# Patient Record
Sex: Female | Born: 1980 | Race: White | Hispanic: No | Marital: Married | Smoking: Never smoker
Health system: Southern US, Community
[De-identification: ages and names within clinical notes are randomized; demographics above are authoritative.]

## PROBLEM LIST (undated history)

## (undated) DIAGNOSIS — G4730 Sleep apnea, unspecified: Secondary | ICD-10-CM

## (undated) DIAGNOSIS — R51 Headache: Secondary | ICD-10-CM

## (undated) DIAGNOSIS — N200 Calculus of kidney: Secondary | ICD-10-CM

## (undated) DIAGNOSIS — F329 Major depressive disorder, single episode, unspecified: Secondary | ICD-10-CM

## (undated) DIAGNOSIS — G5601 Carpal tunnel syndrome, right upper limb: Secondary | ICD-10-CM

## (undated) DIAGNOSIS — M1990 Unspecified osteoarthritis, unspecified site: Secondary | ICD-10-CM

## (undated) DIAGNOSIS — M797 Fibromyalgia: Secondary | ICD-10-CM

## (undated) DIAGNOSIS — K219 Gastro-esophageal reflux disease without esophagitis: Secondary | ICD-10-CM

## (undated) DIAGNOSIS — O99345 Other mental disorders complicating the puerperium: Secondary | ICD-10-CM

## (undated) DIAGNOSIS — F32A Depression, unspecified: Secondary | ICD-10-CM

## (undated) DIAGNOSIS — F53 Postpartum depression: Secondary | ICD-10-CM

## (undated) DIAGNOSIS — O361190 Maternal care for Anti-A sensitization, unspecified trimester, not applicable or unspecified: Secondary | ICD-10-CM

## (undated) DIAGNOSIS — E669 Obesity, unspecified: Secondary | ICD-10-CM

## (undated) HISTORY — PX: LITHOTRIPSY: SUR834

## (undated) HISTORY — DX: Sleep apnea, unspecified: G47.30

---

## 2002-02-19 ENCOUNTER — Inpatient Hospital Stay (HOSPITAL_COMMUNITY): Admission: AD | Admit: 2002-02-19 | Discharge: 2002-02-22 | Payer: Self-pay | Admitting: Obstetrics and Gynecology

## 2002-07-06 ENCOUNTER — Other Ambulatory Visit: Admission: RE | Admit: 2002-07-06 | Discharge: 2002-07-06 | Payer: Self-pay | Admitting: Obstetrics and Gynecology

## 2002-11-02 ENCOUNTER — Ambulatory Visit (HOSPITAL_COMMUNITY): Admission: RE | Admit: 2002-11-02 | Discharge: 2002-11-02 | Payer: Self-pay | Admitting: Obstetrics and Gynecology

## 2002-11-02 ENCOUNTER — Encounter: Payer: Self-pay | Admitting: Obstetrics and Gynecology

## 2002-11-04 ENCOUNTER — Ambulatory Visit (HOSPITAL_COMMUNITY): Admission: RE | Admit: 2002-11-04 | Discharge: 2002-11-04 | Payer: Self-pay | Admitting: Obstetrics and Gynecology

## 2002-11-04 ENCOUNTER — Encounter: Payer: Self-pay | Admitting: Obstetrics and Gynecology

## 2002-12-02 ENCOUNTER — Inpatient Hospital Stay (HOSPITAL_COMMUNITY): Admission: AD | Admit: 2002-12-02 | Discharge: 2002-12-02 | Payer: Self-pay | Admitting: Obstetrics and Gynecology

## 2002-12-16 ENCOUNTER — Encounter: Payer: Self-pay | Admitting: Obstetrics and Gynecology

## 2002-12-16 ENCOUNTER — Observation Stay (HOSPITAL_COMMUNITY): Admission: AD | Admit: 2002-12-16 | Discharge: 2002-12-17 | Payer: Self-pay | Admitting: Obstetrics and Gynecology

## 2002-12-28 ENCOUNTER — Inpatient Hospital Stay (HOSPITAL_COMMUNITY): Admission: AD | Admit: 2002-12-28 | Discharge: 2003-01-01 | Payer: Self-pay | Admitting: Obstetrics and Gynecology

## 2002-12-28 ENCOUNTER — Ambulatory Visit (HOSPITAL_COMMUNITY): Admission: RE | Admit: 2002-12-28 | Discharge: 2002-12-28 | Payer: Self-pay | Admitting: Obstetrics and Gynecology

## 2002-12-28 ENCOUNTER — Encounter: Payer: Self-pay | Admitting: Obstetrics and Gynecology

## 2003-08-04 ENCOUNTER — Other Ambulatory Visit: Admission: RE | Admit: 2003-08-04 | Discharge: 2003-08-04 | Payer: Self-pay | Admitting: Obstetrics and Gynecology

## 2004-07-20 ENCOUNTER — Other Ambulatory Visit: Admission: RE | Admit: 2004-07-20 | Discharge: 2004-07-20 | Payer: Self-pay | Admitting: Obstetrics and Gynecology

## 2010-08-18 ENCOUNTER — Inpatient Hospital Stay (HOSPITAL_COMMUNITY): Admission: AD | Admit: 2010-08-18 | Payer: Self-pay | Source: Ambulatory Visit | Admitting: Obstetrics and Gynecology

## 2010-10-13 ENCOUNTER — Other Ambulatory Visit (HOSPITAL_COMMUNITY): Payer: Self-pay | Admitting: Obstetrics and Gynecology

## 2010-10-18 ENCOUNTER — Encounter (HOSPITAL_COMMUNITY): Payer: Self-pay

## 2010-10-18 ENCOUNTER — Ambulatory Visit (HOSPITAL_COMMUNITY)
Admission: RE | Admit: 2010-10-18 | Discharge: 2010-10-18 | Disposition: A | Discharge status: Home / Self Care | Payer: BC Managed Care – PPO | Source: Ambulatory Visit | Attending: Obstetrics and Gynecology | Admitting: Obstetrics and Gynecology

## 2010-10-18 DIAGNOSIS — O361190 Maternal care for Anti-A sensitization, unspecified trimester, not applicable or unspecified: Secondary | ICD-10-CM | POA: Insufficient documentation

## 2010-10-18 HISTORY — DX: Maternal care for anti-A sensitization, unspecified trimester, not applicable or unspecified: O36.1190

## 2010-10-18 NOTE — Progress Notes (Signed)
Laura Tucker is a 30 y.o. female patient G3 P1101 at approximately [redacted] weeks gestation presents for MFM consultation.    No complaints today.   OB history:  G1 - preeclampsia in late gestation with delivery at 37 weeks;  Patient describes events consistent with intrapartum abruption, shoulder dystocia after vacuum assisted delivery, and neonatal demise of this infant.  G2 - conceived less than 2 months after 1st delivery; Patient reports positive antibody screen in early gestation manage by [facility] MFM service.  She reports undergoing amniocentesis in early gestation for determination of fetal antigen status with need for close ultrasound surveillance through the pregnancy.  She denies need for intrauterine transfusion and also denies development of preeclampsia with this gestation.  She reports undergoing amniocentesis again at [redacted] weeks gestation for determination of fetal lung maturity but developing preterm labor shortly after the procedure.  She underwent primary cesarean section due to her prior poor pregnancy history.  She denies complications.  This child is reported to be alive and well. Patient states she was treated with low dose aspirin during this pregnancy for prevention of preeclampsia.   In the current pregnancy, antibody screen was initially negative.  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  Patient denies any other medical problems or pregnancy complications.      Past Medical History  Diagnosis Date  . 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 *    Review of Systems  All other systems reviewed and are negative.    Physical Exam  Nursing note and vitals reviewed. Constitutional: No distress.    Patient's pregnancy history  discussed as well as the implications of her antibody screen.  Given that current titers are low and late developing in this gestation, overall prognosis is very favorable.  At this time, we recommend serial evaluations of her antibody titers, at least monthly, through the remainder of the pregnancy.  If these reach a value of 1:16, serial ultrasound monitoring of fetal MCA velocities is indicated as well as regular assessments for the development of hydrops.  If these values remain under 1:16, monitoring of MCA velocities does not provide additional benefit and is not indicated.    Given, patient's past pregnancy history, close surveillance for the development of preeclampsia is indicated and evaluation of fetal growth and amniotic fluid volume serially through the third trimester every 4-6 weeks may be of benefit.  Antenatal testing beginning at [redacted] weeks gestation (sooner if other complications develop) is also recommended.  She states that she plans a repeat cesarean section.  Review of prior operative notes are recommended.  Signs and symptoms of preeclampsia were reviewed.    Patient also inquired about starting low dose aspirin for the prevention of preeclampsia.  Given her gestational age, the possible increased risks of abruption with aspirin use,  and the inconsistency of current evidence related to this, it was not recommended that she initiate this therapy at this time.   Questions were answered and precautions given.  We would be pleased to participate further in any way that you desire in the care of this patient.  Please contact us at any time if we can be of assistance.    Lott Seelbach L. 10/18/2010

## 2010-10-24 ENCOUNTER — Encounter (HOSPITAL_COMMUNITY): Payer: Self-pay

## 2010-10-24 ENCOUNTER — Inpatient Hospital Stay (HOSPITAL_COMMUNITY): Payer: BC Managed Care – PPO

## 2010-10-24 ENCOUNTER — Inpatient Hospital Stay (HOSPITAL_COMMUNITY)
Admission: AD | Admit: 2010-10-24 | Discharge: 2010-10-24 | Disposition: A | Discharge status: Home / Self Care | Payer: BC Managed Care – PPO | Source: Ambulatory Visit | Attending: Obstetrics and Gynecology | Admitting: Obstetrics and Gynecology

## 2010-10-24 ENCOUNTER — Other Ambulatory Visit: Payer: Self-pay | Admitting: Obstetrics and Gynecology

## 2010-10-24 DIAGNOSIS — N390 Urinary tract infection, site not specified: Secondary | ICD-10-CM | POA: Insufficient documentation

## 2010-10-24 DIAGNOSIS — O2390 Unspecified genitourinary tract infection in pregnancy, unspecified trimester: Secondary | ICD-10-CM | POA: Insufficient documentation

## 2010-10-24 DIAGNOSIS — O4690 Antepartum hemorrhage, unspecified, unspecified trimester: Secondary | ICD-10-CM

## 2010-10-24 HISTORY — DX: Calculus of kidney: N20.0

## 2010-10-24 LAB — URINALYSIS, ROUTINE W REFLEX MICROSCOPIC
Bilirubin Urine: NEGATIVE
Glucose, UA: NEGATIVE mg/dL
Ketones, ur: NEGATIVE mg/dL
Nitrite: NEGATIVE
Protein, ur: NEGATIVE mg/dL
Specific Gravity, Urine: <1.005 — ABNORMAL LOW (ref 1.005–1.030)
Urobilinogen, UA: 0.2 mg/dL (ref 0.0–1.0)
pH: 6.5 (ref 5.0–8.0)

## 2010-10-24 LAB — WET PREP, GENITAL
Trich, Wet Prep: NONE SEEN
Yeast Wet Prep HPF POC: NONE SEEN

## 2010-10-24 LAB — COMPREHENSIVE METABOLIC PANEL
AST: 12 U/L (ref 0–37)
BUN: 6 mg/dL (ref 6–23)
Calcium: 10 mg/dL (ref 8.4–10.5)
GFR calc Af Amer: >60 mL/min (ref >60)
GFR calc non Af Amer: >60 mL/min (ref >60)
Potassium: 3.4 mEq/L — ABNORMAL LOW (ref 3.5–5.1)
Sodium: 136 mEq/L (ref 135–145)
Total Bilirubin: 0.3 mg/dL (ref 0.3–1.2)
Total Protein: 7.2 g/dL (ref 6.0–8.3)

## 2010-10-24 LAB — CBC
MCHC: 35.5 g/dL (ref 30.0–36.0)
RDW: 15 % (ref 11.5–15.5)

## 2010-10-24 LAB — URINE MICROSCOPIC-ADD ON

## 2010-10-24 NOTE — Progress Notes (Signed)
Pt states that last night she started having itching on her legs, noticed increased swelling in lower extremities. This morning had spotting on tissue after wiping. Itching continues on legs.

## 2010-10-24 NOTE — ED Provider Notes (Signed)
History    30 y/o [place] called office this am with CC Itching and edema in lower ext. And spot of BRB on tissue after wiping.  Chief Complaint  Patient presents with  . Leg Swelling   HPI  OB History    Grav Para Term Preterm Abortions TAB SAB Ect Mult Living   3 2 1 1      1       Past Medical History  Diagnosis Date  . 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 Pregnancy induced hypertension   . Kidney stones   . Asthma     Past Surgical History  Procedure Date  . Cesarean section   . Lithotripsy     No family history on file.  History  Substance Use Topics  . Smoking status: Never Smoker   . Smokeless tobacco: Never Used  . Alcohol Use: No    Allergies:  Allergies  Allergen Reactions  . Augmentin Nausea And Vomiting  . Imitrex (Sumatriptan Base) Palpitations    Prescriptions prior to admission  Medication Sig Dispense Refill  . cetirizine (ZYRTEC) 10 MG tablet Take 10 mg by mouth daily.        . diphenhydrAMINE (BENADRYL) 25 MG tablet Take 25 mg by mouth every 6 (six) hours as needed. For rash.       . Multiple Vitamins-Minerals (MULTIVITAMIN WITH MINERALS) tablet Take 1 tablet by mouth daily.        . promethazine (PHENERGAN) 25 MG tablet Take 25 mg by mouth every 6 (six) hours as needed. For nausea and vomiting.         Review of Systems  Constitutional: Negative.   HENT: Negative.   Eyes: Negative.   Respiratory: Negative.   Cardiovascular: Negative.   Gastrointestinal: Positive for abdominal pain (mild lower abd. cramping). Negative for diarrhea, constipation, blood in stool and melena.  Genitourinary: Negative for dysuria, urgency, frequency, hematuria and flank pain.  Musculoskeletal: Negative.   Skin: Negative.   Neurological: Negative.   Endo/Heme/Allergies: Negative.     Psychiatric/Behavioral: Negative.    Physical Exam   Blood pressure 125/83, pulse 107, temperature 98 F (36.7 C), temperature source Oral, resp. rate 16, height 5' 2.5" (1.588 m), weight 109.408 kg (241 lb 3.2 oz), SpO2 98.00%.  Physical Exam  Constitutional: She is oriented to person, place, and time. She appears well-developed and well-nourished.  HENT:  Head: Normocephalic.  Neck: Normal range of motion.  Cardiovascular: Normal rate.   Respiratory: Effort normal.  GI: Soft. Bowel sounds are normal. She exhibits no distension and no mass. There is no tenderness. There is no rebound and no guarding.  Genitourinary: Vagina normal and uterus normal. No vaginal discharge found.  Musculoskeletal: Normal range of motion. She exhibits no edema and no tenderness.  Neurological: She is alert and oriented to person, place, and time.  Skin: Skin is warm and dry.  Psychiatric: She has a normal mood and affect. Her behavior is normal. Judgment and thought content normal.  No evidence of Edema Sporadic red papules noted on arms and legs consistent with insect bites  Lab Results  Component Value Date   WBC 11.0* 10/24/2010   HGB 12.2 10/24/2010   HCT 34.4* 10/24/2010   MCV 90.5 10/24/2010   PLT 238 10/24/2010  FHR 145 Cat I no decels approp for gest age Toco:  no UC's or irrit noted CMP WNL, Urine with sm blood, Sm. Leuko, WBC 7-10, hazy Sono: CL 4.1 cms, Placenta with no evidence of abruption, GFM and AFI normal  Spec exam: Os closed, no blood or discharge Wet prep and cultures (GCC) done Wet prep neg.  MAU Course  Procedures  MDM   Assessment and Plan  1. 25 wk SIUP with episode of bleeding 2. Probable UTI 3. Hx 37+ wk IUFD R/T Abruptio 4. + Antibodies screen   Plan: 1. Discharge home 2. Macrobid 100 mg po q 12 hrs x 7 days 3. Urine C&S 4. Pelvic rest 5. Keep scheduled appt. With Dr. Estanislado Pandy (titers) 6. Consulted with Dr. Normand Sloop for management.    Anabel Halon 10/24/2010, 2:34  PM

## 2010-10-24 NOTE — ED Notes (Signed)
Call to [facility] in lab regarding add on Urine culture, spec in lab

## 2010-10-25 LAB — URINE CULTURE: Colony Count: 50000

## 2010-11-02 ENCOUNTER — Inpatient Hospital Stay (HOSPITAL_COMMUNITY)
Admission: AD | Admit: 2010-11-02 | Discharge: 2010-11-02 | Disposition: A | Discharge status: Home / Self Care | Payer: BC Managed Care – PPO | Source: Ambulatory Visit | Attending: Obstetrics and Gynecology | Admitting: Obstetrics and Gynecology

## 2010-11-02 ENCOUNTER — Encounter (HOSPITAL_COMMUNITY): Payer: Self-pay | Admitting: *Deleted

## 2010-11-02 DIAGNOSIS — N938 Other specified abnormal uterine and vaginal bleeding: Secondary | ICD-10-CM | POA: Insufficient documentation

## 2010-11-02 DIAGNOSIS — N949 Unspecified condition associated with female genital organs and menstrual cycle: Secondary | ICD-10-CM | POA: Insufficient documentation

## 2010-11-02 DIAGNOSIS — N200 Calculus of kidney: Secondary | ICD-10-CM

## 2010-11-02 DIAGNOSIS — R319 Hematuria, unspecified: Secondary | ICD-10-CM | POA: Insufficient documentation

## 2010-11-02 HISTORY — DX: Carpal tunnel syndrome, right upper limb: G56.01

## 2010-11-02 LAB — CBC
Hemoglobin: 11.3 g/dL — ABNORMAL LOW (ref 12.0–15.0)
MCH: 31.2 pg (ref 26.0–34.0)
MCV: 91.4 fL (ref 78.0–100.0)
RBC: 3.62 MIL/uL — ABNORMAL LOW (ref 3.87–5.11)

## 2010-11-02 LAB — URINE MICROSCOPIC-ADD ON

## 2010-11-02 LAB — URINALYSIS, ROUTINE W REFLEX MICROSCOPIC
Glucose, UA: NEGATIVE mg/dL
Ketones, ur: 15 mg/dL — AB
pH: 7.5 (ref 5.0–8.0)

## 2010-11-02 MED ORDER — CEFAZOLIN SODIUM-DEXTROSE 2-3 GM-% IV SOLR
2.0000 g | Freq: Once | INTRAVENOUS | Status: DC
  Filled 2010-11-02: qty 50

## 2010-11-02 MED ORDER — LACTATED RINGERS IV SOLN
INTRAVENOUS | Status: DC
  Administered 2010-11-02: 09:00:00 via INTRAVENOUS

## 2010-11-02 MED ORDER — NALBUPHINE SYRINGE 5 MG/0.5 ML
10.0000 mg | INJECTION | Freq: Once | INTRAMUSCULAR | Status: AC
  Administered 2010-11-02: 10 mg via INTRAVENOUS
  Filled 2010-11-02: qty 1

## 2010-11-02 MED ORDER — CEFAZOLIN SODIUM-DEXTROSE 1-4 GM-% IV SOLR
2.0000 g | Freq: Once | INTRAVENOUS | Status: DC
  Administered 2010-11-02: 1 g via INTRAVENOUS
  Filled 2010-11-02: qty 100

## 2010-11-02 NOTE — Progress Notes (Signed)
Cramping started this morning around 0600, lower abd.  First noted bleeding after showered.  Hx of abruption with previous preg.  Denies previa,  Hx of bleeding last Tues.

## 2010-11-02 NOTE — ED Notes (Signed)
Rails up, call light in reach.  Plan discussed.  Pt to rest

## 2010-11-02 NOTE — Progress Notes (Signed)
Ancef 2 gms completed, with approx 150 ML of IVF to infuse States feeling better and arousing from Nubain Plan of care discussed with pt and family member @ BS Plan to place back on EFM prior to D/C for brief monitoring Appt. Today cancelled and rescheduled for Tuesday @ 1500 with Dr. Estanislado Pandy Will d/c home with Renal Calculi precautions, urine strainer, increase po hydration, rest and Percocet 5/325 #30, 1 po q 3-4 hours prn for pain

## 2010-11-02 NOTE — ED Provider Notes (Signed)
History   30 y/o G3P1101 @ 26.2 wks presented to MAU with blood in toilet after voiding this am and lower abd. Pain. Unsure where bleeding is coming from @ this time, but has only noted after voiding. Reports GFM, No LOF Was evaluated last week in MAU for lower abd. Pain, and diagnosed with UTI Rx Macrobid x 7 days with last dose due today. F/U in office confirmed C&S pos and sensitive to Macrobid. Pt states has hx of Renal calculi, but this does not feel the same. Hx of Abruptio @ 37 wks and IUFD.  Pt states has appt. Today with Dr,. Rivard for glucola and antibody titers for pos anti-c antibody Has not seen Urology in "a while" but has established MD in [place] .  Chief Complaint  Patient presents with  . Abdominal Cramping  . Vaginal Bleeding   Abdominal Cramping This is a new problem. The current episode started today. The onset quality is sudden. The problem occurs constantly. The problem has been gradually improving. The pain is located in the generalized abdominal region and suprapubic region. The pain is at a severity of 8/10. The pain is moderate. The quality of the pain is aching, burning and cramping. The abdominal pain does not radiate. Associated symptoms include hematuria (Gross Hematuria). Pertinent negatives include no anorexia, arthralgias, belching, constipation, diarrhea, fever, flatus, frequency, headaches, hematochezia, melena, myalgias, nausea, vomiting or weight loss. The pain is aggravated by nothing. The pain is relieved by nothing. Treatments tried: Ibuprofen. The treatment provided mild relief. Abdominal surgery: C-Section. Renal calculi, Lithotripsy, "Kinked Ureter"  Vaginal Bleeding Associated symptoms include hematuria (Gross Hematuria). Pertinent negatives include no anorexia, constipation, diarrhea, fever, frequency, headaches, nausea or vomiting. Abdominal surgery: C-Section. (Renal calculi, Lithotripsy, "Kinked Ureter")    OB History    Grav Para Term Preterm  Abortions TAB SAB Ect Mult Living   3 2 1 1      1       Past Medical History  Diagnosis Date  . 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 Pregnancy induced hypertension   . Kidney stones   . Asthma   . Carpal tunnel syndrome of right wrist     Past Surgical History  Procedure Date  . Cesarean section   . Lithotripsy     stint and stone removal ( ureter kinked)    No family history on file.  History  Substance Use Topics  . Smoking status: Never Smoker   . Smokeless tobacco: Never Used  . Alcohol Use: No    Allergies:  Allergies  Allergen Reactions  . Augmentin Nausea And Vomiting  . Imitrex (Sumatriptan Base) Palpitations    Prescriptions prior to admission  Medication Sig Dispense Refill  . cetirizine (ZYRTEC) 10 MG tablet Take 10 mg by mouth daily.        . diphenhydrAMINE (BENADRYL) 25 MG tablet Take 25 mg by mouth every 6 (six) hours as needed. For rash.       . Multiple Vitamins-Minerals (MULTIVITAMIN WITH MINERALS) tablet Take 1 tablet by mouth daily.        . promethazine (PHENERGAN) 25 MG tablet Take 25 mg by mouth every 6 (six) hours as needed. For nausea and vomiting.         Review of Systems  Constitutional: Negative.  Negative for fever and weight loss.  HENT: Negative.  Eyes: Negative.   Respiratory: Negative.   Cardiovascular: Negative.   Gastrointestinal: Negative.  Negative for nausea, vomiting, diarrhea, constipation, melena, hematochezia, anorexia and flatus.  Genitourinary: Positive for hematuria (Gross Hematuria) and vaginal bleeding. Negative for frequency.  Musculoskeletal: Negative for myalgias and arthralgias.       R CTS  Skin: Negative.   Neurological: Negative.  Negative for headaches.  Endo/Heme/Allergies: Negative.   Psychiatric/Behavioral: The patient is nervous/anxious  (Anxiety).    Physical Exam   Blood pressure 136/83, pulse 99, temperature 98.5 F (36.9 C), temperature source Oral, resp. rate 18, height 5\' 3"  (1.6 m), weight 109.408 kg (241 lb 3.2 oz).  Physical Exam  Constitutional: She is oriented to person, place, and time. She appears well-developed and well-nourished.  HENT:  Head: Normocephalic.  Neck: Normal range of motion.  Cardiovascular: Normal rate and regular rhythm.   Respiratory: Effort normal.  GI: Soft. She exhibits no distension and no mass. There is no tenderness. There is no rebound and no guarding.  Genitourinary: Vagina normal and uterus normal. No vaginal discharge found.  Musculoskeletal: Normal range of motion.  Neurological: She is alert and oriented to person, place, and time.  Skin: Skin is warm and dry. No rash noted. No erythema. No pallor.  Psychiatric: She has a normal mood and affect. Her behavior is normal. Judgment and thought content normal.  Spec exam unremarkable, Cx appears closed, clean with no blood in vault.  FHR approp for gest age, no decels and Pt reports GFM, No UC's or irrit noted  MAU Course  Procedures  MDM   Assessment and Plan  1. Gross Hematuria 2. Lower abd. Pain  Plan: Consulted with Dr. Pennie Rushing Continue IVF S/P Nubain and pt feels much relief Continue to Observe closely and Dr. Pennie Rushing will follow for Urology consult. Pt notified of Plan of care and agree.  Anabel Halon 11/02/2010, 9:21 AM

## 2010-11-02 NOTE — Progress Notes (Signed)
Patient reports when she urinates it is red and pink, onset of lower abdominal cramping this morning

## 2010-11-02 NOTE — Progress Notes (Signed)
Earlier baby was not moving not, now back to being "wild child"

## 2010-11-02 NOTE — ED Notes (Signed)
2nd bag- 1gm Ancef hung, unable to document in [facility].  Spoke with Pharmacy- instructed per Mindy to document here. (initial order 1gr x2- give 2 gr)  2nd followed first.

## 2010-11-02 NOTE — ED Notes (Signed)
Pt resting. Rails up, call light in reach. Pt is comfortable, though states pain med is wearing off, not as drowsy.  Dr Pennie Rushing on unit, reviewed labs from today and last wk, discussing plan with pt's urologist.  Increased IV fluid rate (verbal order from Dr Pennie Rushing).

## 2010-11-02 NOTE — Progress Notes (Signed)
S: Pt is improved after Nubain.  Sleeping but arousable and in less pain. Voided once. O:  Fhr Stable.       No contractions noted. A:  Probable recurrent renal stone disease with Hematuria P: Discussed pt with Dr. Edwin Cap at [facility] who recommends hydration, treatment of infection and pain management, but no specific radiologic imaging.      Ancef ordered.

## 2010-11-02 NOTE — Progress Notes (Signed)
FHR: 150's and approp for Gest age with no decels, and Very active Fetal movement Toco: No UC's, Irritablity or or cramping present No Vag. Bleeding present  D/C home in stable cond. With previously discussed discharge instructions.

## 2010-11-22 ENCOUNTER — Other Ambulatory Visit: Payer: Self-pay | Admitting: Obstetrics and Gynecology

## 2010-12-13 ENCOUNTER — Inpatient Hospital Stay (HOSPITAL_COMMUNITY): Payer: BC Managed Care – PPO

## 2010-12-13 ENCOUNTER — Inpatient Hospital Stay (HOSPITAL_COMMUNITY)
Admission: AD | Admit: 2010-12-13 | Discharge: 2010-12-13 | Disposition: A | Discharge status: Home / Self Care | Payer: BC Managed Care – PPO | Source: Ambulatory Visit | Attending: Obstetrics and Gynecology | Admitting: Obstetrics and Gynecology

## 2010-12-13 ENCOUNTER — Encounter (HOSPITAL_COMMUNITY): Payer: Self-pay | Admitting: *Deleted

## 2010-12-13 DIAGNOSIS — O368390 Maternal care for abnormalities of the fetal heart rate or rhythm, unspecified trimester, not applicable or unspecified: Secondary | ICD-10-CM | POA: Insufficient documentation

## 2010-12-13 HISTORY — DX: Headache: R51

## 2010-12-13 HISTORY — DX: Gastro-esophageal reflux disease without esophagitis: K21.9

## 2010-12-13 NOTE — Discharge Instructions (Signed)
Kick Count Fetal Movement Counts Kick counts is highly recommended in high risk pregnancies, but it is a good idea for every pregnant woman to do. Start counting fetal movements at 28 weeks of the pregnancy. Fetal movements increase after eating a full meal or eating or drinking something sweet (the blood sugar is higher). It is also important to drink plenty of fluids (well hydrated) before doing the count. Lie on your left side because it helps with the circulation or you can sit in a comfortable chair with your arms over your belly (abdomen) with no distractions around you. DOING THE COUNT:  Try to do the count the same time of day each time you do it.   Mark the day and time, then see how long it takes for you to feel 10 movements (kicks, flutters, swishes, rolls). You should have at least 10 movements within 2 hours. You will most likely feel 10 movements in much less than 2 hours. If you do not, wait an hour and count again. After a couple of days you will see a pattern.   What you are looking for is a change in the pattern or not enough counts in 2 hours. Is it taking longer in time to reach 10 movements?  SEEK MEDICAL CARE IF:  You feel less than 10 counts in 2 hours. Tried twice.   No movement in one hour.   The pattern is changing or taking longer each day to reach 10 counts in 2 hours.   You feel the baby is not moving as it usually does.   Date  Movements Start time Finish time  Date  Movements Start time Finish  time    Sun      Sun     W  Mon    W  Mon     E  Tue    E  Tue     E  Wed    E  Wed     K  Thur    K  Thur       Fri      Fri       Sat      Sat       Sun      Sun     W  Mon    W  Mon     E  Tue    E  Tue     E  Wed    E  Wed     K  Thur    K  Thur       Fri      Fri       Sat      Sat       Sun      Sun     W  Mon    W  Mon     E  Tue    E  Tue     E  Wed    E  Wed     K  Thur    K  Thur       Fri      Fri       Sat      Sat       Sun      Sun       W  Mon    W  Mon     E  Tue    E  Tue       E  Wed    E  Wed     K  Thur    K  Thur       Fri      Fri       Sat      Sat     Document Released: 04/04/2006 Document Re-Released: 08/23/2009 [employer] Patient Information 2011 [employer]. 

## 2010-12-13 NOTE — ED Provider Notes (Signed)
History   30 yo G3P2001 at 81 1/7 weeks presents from office for monitoring and BPP.  Had non-reactive NST in office, normal growth and fluid on [place].  Was initiating antenatal testing for hx of IUFD at 37 weeks with first pregnancy.    Pregnancy remarkable for: Hx IUFD at 37 weeks, with abruption, pre-eclampsia Anti C antibody Previous C/S with plan for repeat Migraines Kidney stones this pregnancy Allergy induced asthma   OB History    Grav Para Term Preterm Abortions TAB SAB Ect Mult Living   3 2 1 1      1     Pregnancy #1--IUFD at 37 1/2 weeks.  Pre-eclampsia, with abruption Pregnancy #2--Scheduled C/S , with abnormal antibody screen (anti C) Pregnancy #3--Current   Past Medical History  Diagnosis Date  . 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 Pregnancy induced hypertension   . Kidney stones   . Asthma   . Carpal tunnel syndrome of right wrist     Past Surgical History  Procedure Date  . Cesarean section   . Lithotripsy     stint and stone removal ( ureter kinked)    FHx: Hypertension in parents; Mom with borderline diabetes, kidney stones, lung Ca, depression.  History  Substance Use Topics  . Smoking status: Never Smoker   . Smokeless tobacco: Never Used  . Alcohol Use: No    Allergies:  Allergies  Allergen Reactions  . Augmentin Nausea And Vomiting  . Imitrex (Sumatriptan Base) Palpitations    Prescriptions prior to admission  Medication Sig Dispense Refill  . cetirizine (ZYRTEC) 10 MG tablet Take 10 mg by mouth daily.        . diphenhydrAMINE (BENADRYL) 25 MG tablet Take 25 mg by mouth every 6 (six) hours as needed. For rash.       . Multiple Vitamins-Minerals (MULTIVITAMIN WITH MINERALS) tablet Take 1 tablet by mouth daily.        . promethazine (PHENERGAN) 25 MG tablet Take 25 mg by mouth  every 6 (six) hours as needed. For nausea and vomiting.          Physical Exam   Blood pressure 130/83, pulse 109, temperature 97.3 F (36.3 C), temperature source Oral, resp. rate 20, height 5\' 3"  (1.6 m), weight 110.678 kg (244 lb).  Chest clear Heart RRR Abd gravid, NT NST--Reactive, no decels No UCs at present  ED Course  Per orders of Dr. Estanislado Pandy: Feed patient Monitor BPP  [name], CNM 12/13/10 1900

## 2010-12-13 NOTE — Progress Notes (Signed)
Notified of BPP 8/8.  Pt does not need to go back on efm per CNM.  Will be down to discharge pt.

## 2010-12-13 NOTE — Progress Notes (Signed)
Decreased fetal movement 

## 2010-12-13 NOTE — Progress Notes (Signed)
Explained to pt that after she finishes eating she will go to [place].

## 2010-12-13 NOTE — Progress Notes (Signed)
V. Latham, CNM at bedside.  Assessment done and poc discussed with pt.  

## 2010-12-13 NOTE — ED Provider Notes (Signed)
Pt back from BPP = 8/8  Will D/C pt, has f/u appt at [facility], Monday [facility] and PTL precautions given

## 2010-12-13 NOTE — Progress Notes (Signed)
Gevena Barre, CNM at bedside.  Fetal strip and BPP results discussed with pt.  Will dc home.

## 2010-12-28 ENCOUNTER — Encounter (HOSPITAL_COMMUNITY): Payer: Self-pay | Admitting: *Deleted

## 2010-12-28 ENCOUNTER — Inpatient Hospital Stay (HOSPITAL_COMMUNITY)
Admission: AD | Admit: 2010-12-28 | Discharge: 2010-12-28 | Disposition: A | Discharge status: Home / Self Care | Payer: BC Managed Care – PPO | Source: Ambulatory Visit | Attending: Obstetrics and Gynecology | Admitting: Obstetrics and Gynecology

## 2010-12-28 DIAGNOSIS — R51 Headache: Secondary | ICD-10-CM | POA: Insufficient documentation

## 2010-12-28 DIAGNOSIS — R42 Dizziness and giddiness: Secondary | ICD-10-CM | POA: Insufficient documentation

## 2010-12-28 DIAGNOSIS — O99891 Other specified diseases and conditions complicating pregnancy: Secondary | ICD-10-CM | POA: Insufficient documentation

## 2010-12-28 DIAGNOSIS — IMO0002 Reserved for concepts with insufficient information to code with codable children: Secondary | ICD-10-CM | POA: Insufficient documentation

## 2010-12-28 LAB — COMPREHENSIVE METABOLIC PANEL
ALT: 13 U/L (ref 0–35)
AST: 14 U/L (ref 0–37)
Albumin: 2.4 g/dL — ABNORMAL LOW (ref 3.5–5.2)
Alkaline Phosphatase: 140 U/L — ABNORMAL HIGH (ref 39–117)
Calcium: 9.6 mg/dL (ref 8.4–10.5)
Glucose, Bld: 96 mg/dL (ref 70–99)
Potassium: 3.3 mEq/L — ABNORMAL LOW (ref 3.5–5.1)
Sodium: 137 mEq/L (ref 135–145)
Total Protein: 6.2 g/dL (ref 6.0–8.3)

## 2010-12-28 LAB — CBC
Hemoglobin: 11.1 g/dL — ABNORMAL LOW (ref 12.0–15.0)
MCH: 30.6 pg (ref 26.0–34.0)
MCHC: 34.2 g/dL (ref 30.0–36.0)
Platelets: 229 10*3/uL (ref 150–400)
RDW: 15.1 % (ref 11.5–15.5)

## 2010-12-28 LAB — URINALYSIS, ROUTINE W REFLEX MICROSCOPIC
Ketones, ur: NEGATIVE mg/dL
Nitrite: NEGATIVE
Urobilinogen, UA: 0.2 mg/dL (ref 0.0–1.0)
pH: 7 (ref 5.0–8.0)

## 2010-12-28 LAB — URINE MICROSCOPIC-ADD ON

## 2010-12-28 NOTE — ED Provider Notes (Signed)
History    30 yo G3P1101 at 34 2/7 weeks presented unannounced c/o HA, dizziness, swelling last 24 hours.  Denies contractions, leaking, bleeding, dysuria, epigastric pain, or any other issue.  Took BP at home--140s/91 x 1, then 130/70.  Pregnancy remarkable for:  Hx IUFD at 37 weeks, with abruption, pre-eclampsia  Anti C antibody--most recent titer 1:8 (up from 1:4 in July) Previous C/S with plan for repeat  Migraines  Kidney stones this pregnancy  Allergy induced asthma   Chief Complaint  Patient presents with  . Hypertension     OB History    Grav Para Term Preterm Abortions TAB SAB Ect Mult Living   3 2 1 1      1       Past Medical History  Diagnosis Date  . 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 Pregnancy induced hypertension   . Kidney stones   . Asthma   . Carpal tunnel syndrome of right wrist   . Headache   . GERD (gastroesophageal reflux disease)     Past Surgical History  Procedure Date  . Cesarean section   . Lithotripsy     stint and stone removal ( ureter kinked)    No family history on file.  History  Substance Use Topics  . Smoking status: Never Smoker   . Smokeless tobacco: Never Used  . Alcohol Use: No    Allergies:  Allergies  Allergen Reactions  . Augmentin Nausea And Vomiting  . Imitrex (Sumatriptan Base) Palpitations    Prescriptions prior to admission  Medication Sig Dispense Refill  . cetirizine (ZYRTEC) 10 MG tablet Take 10 mg by mouth daily.       Marland Kitchen guaiFENesin (MUCINEX) 600 MG 12 hr tablet Take 600-1,200 mg by mouth 2 (two) times daily as needed. For cough/cold       . Multiple Vitamins-Minerals (MULTIVITAMIN WITH MINERALS) tablet Take 1 tablet by mouth daily.       . pantoprazole (PROTONIX) 40 MG tablet Take 40 mg by mouth daily.        Marland Kitchen Dextromethorphan Polistirex  (DELSYM PO) Take 10 mLs by mouth 2 (two) times daily as needed. For cough       . diphenhydrAMINE (BENADRYL) 25 MG tablet Take 25 mg by mouth every 6 (six) hours as needed. For rash.      . promethazine (PHENERGAN) 25 MG tablet Take 25 mg by mouth every 6 (six) hours as needed. For nausea and vomiting.        See above subjective section Physical Exam   Blood pressure 127/75, pulse 105, temperature 98.5 F (36.9 C), temperature source Oral, resp. rate 20, height 5\' 3"  (1.6 m), weight 111.857 kg (246 lb 9.6 oz), SpO2 99.00%. Filed Vitals:   12/28/10 1013 12/28/10 1031 12/28/10 1044 12/28/10 1059  BP: 139/78 117/67 125/71 127/75  Pulse: 114 108 99 105  Temp:      TempSrc:      Resp: 20     Height:      Weight:      SpO2:       Chest clear Heart RRR without murmur Abdomen Gravid, NT Ext DTR 1+ without clonus, 1+ edema  FHR reactive, no decels No UCs   ED Course  Await completion of [facility] labs Will consult with Dr. Normand Sloop then  [name], CNM 12/28/10  1120  

## 2010-12-28 NOTE — Progress Notes (Signed)
Pt states she has been having a headache, epigastric pain, swelling and elevated BP in the office.

## 2010-12-28 NOTE — ED Provider Notes (Signed)
Reviewed labs and results with Dr. Normand Sloop. Plan:  Patient to begin maternity leave now.           Keep scheduled NST and lab appointment on Monday.           Patient d/c'd home with [facility] and [facility] information.           To call prn with any questions or concerns.  Filed Vitals:   12/28/10 1031 12/28/10 1044 12/28/10 1059 12/28/10 1205  BP: 117/67 125/71 127/75 127/80  Pulse: 108 99 105 65  Temp:      TempSrc:      Resp:    20  Height:      Weight:      SpO2:        Results for orders placed during the hospital encounter of 12/28/10 (from the past 24 hour(s))  URINALYSIS, ROUTINE W REFLEX MICROSCOPIC     Status: Abnormal   Collection Time   12/28/10 10:00 AM      Component Value Range   Color, Urine YELLOW  YELLOW    Appearance CLEAR  CLEAR    Specific Gravity, Urine 1.010  1.005 - 1.030    pH 7.0  5.0 - 8.0    Glucose, UA NEGATIVE  NEGATIVE (mg/dL)   Hgb urine dipstick SMALL (*) NEGATIVE    Bilirubin Urine NEGATIVE  NEGATIVE    Ketones, ur NEGATIVE  NEGATIVE (mg/dL)   Protein, ur NEGATIVE  NEGATIVE (mg/dL)   Urobilinogen, UA 0.2  0.0 - 1.0 (mg/dL)   Nitrite NEGATIVE  NEGATIVE    Leukocytes, UA MODERATE (*) NEGATIVE   URINE MICROSCOPIC-ADD ON     Status: Abnormal   Collection Time   12/28/10 10:00 AM      Component Value Range   Squamous Epithelial / LPF FEW (*) RARE    WBC, UA 11-20  <3 (WBC/hpf)   RBC / HPF 0-2  <3 (RBC/hpf)   Bacteria, UA FEW (*) RARE   CBC     Status: Abnormal   Collection Time   12/28/10 10:30 AM      Component Value Range   WBC 10.5  4.0 - 10.5 (K/uL)   RBC 3.63 (*) 3.87 - 5.11 (MIL/uL)   Hemoglobin 11.1 (*) 12.0 - 15.0 (g/dL)   HCT 30.8 (*) 65.7 - 46.0 (%)   MCV 89.5  78.0 - 100.0 (fL)   MCH 30.6  26.0 - 34.0 (pg)   MCHC 34.2  30.0 - 36.0 (g/dL)   RDW 84.6  96.2 - 95.2 (%)   Platelets 229  150 - 400 (K/uL)  COMPREHENSIVE METABOLIC PANEL     Status: Abnormal   Collection Time   12/28/10 10:30 AM      Component Value Range   Sodium 137   135 - 145 (mEq/L)   Potassium 3.3 (*) 3.5 - 5.1 (mEq/L)   Chloride 108  96 - 112 (mEq/L)   CO2 20  19 - 32 (mEq/L)   Glucose, Bld 96  70 - 99 (mg/dL)   BUN 5 (*) 6 - 23 (mg/dL)   Creatinine, Ser 8.41  0.50 - 1.10 (mg/dL)   Calcium 9.6  8.4 - 32.4 (mg/dL)   Total Protein 6.2  6.0 - 8.3 (g/dL)   Albumin 2.4 (*) 3.5 - 5.2 (g/dL)   AST 14  0 - 37 (U/L)   ALT 13  0 - 35 (U/L)   Alkaline Phosphatase 140 (*) 39 - 117 (U/L)   Total Bilirubin 0.3  0.3 - 1.2 (mg/dL)   GFR calc non Af Amer >90  >90 (mL/min)   GFR calc Af Amer >90  >90 (mL/min)  LACTATE DEHYDROGENASE     Status: Normal   Collection Time   12/28/10 10:30 AM      Component Value Range   LD 243  94 - 250 (U/L)  URIC ACID     Status: Normal   Collection Time   12/28/10 10:30 AM      Component Value Range   Uric Acid, Serum 3.7  2.4 - 7.0 (mg/dL)   [name], CNM 16/10/96 1220

## 2010-12-29 LAB — URINE CULTURE: Culture: NO GROWTH

## 2011-01-05 ENCOUNTER — Ambulatory Visit (HOSPITAL_COMMUNITY): Payer: BC Managed Care – PPO

## 2011-01-09 ENCOUNTER — Encounter (HOSPITAL_COMMUNITY): Payer: Self-pay | Admitting: *Deleted

## 2011-01-09 ENCOUNTER — Encounter (HOSPITAL_COMMUNITY)
Admission: AD | Disposition: A | Discharge status: Home / Self Care | Payer: Self-pay | Source: Ambulatory Visit | Attending: Obstetrics and Gynecology

## 2011-01-09 ENCOUNTER — Inpatient Hospital Stay (HOSPITAL_COMMUNITY): Payer: BC Managed Care – PPO | Admitting: Anesthesiology

## 2011-01-09 ENCOUNTER — Inpatient Hospital Stay (HOSPITAL_COMMUNITY)
Admission: RE | Admit: 2011-01-09 | Payer: BC Managed Care – PPO | Source: Ambulatory Visit | Admitting: Obstetrics and Gynecology

## 2011-01-09 ENCOUNTER — Other Ambulatory Visit: Payer: Self-pay | Admitting: Obstetrics and Gynecology

## 2011-01-09 ENCOUNTER — Encounter (HOSPITAL_COMMUNITY): Payer: Self-pay | Admitting: Anesthesiology

## 2011-01-09 ENCOUNTER — Inpatient Hospital Stay (HOSPITAL_COMMUNITY)
Admission: AD | Admit: 2011-01-09 | Discharge: 2011-01-12 | DRG: 370 | Disposition: A | Discharge status: Home / Self Care | Payer: BC Managed Care – PPO | Source: Ambulatory Visit | Attending: Obstetrics and Gynecology | Admitting: Obstetrics and Gynecology

## 2011-01-09 DIAGNOSIS — Z98891 History of uterine scar from previous surgery: Secondary | ICD-10-CM

## 2011-01-09 DIAGNOSIS — O361190 Maternal care for Anti-A sensitization, unspecified trimester, not applicable or unspecified: Secondary | ICD-10-CM | POA: Diagnosis present

## 2011-01-09 DIAGNOSIS — O9902 Anemia complicating childbirth: Secondary | ICD-10-CM | POA: Diagnosis present

## 2011-01-09 DIAGNOSIS — O09299 Supervision of pregnancy with other poor reproductive or obstetric history, unspecified trimester: Secondary | ICD-10-CM

## 2011-01-09 DIAGNOSIS — D649 Anemia, unspecified: Secondary | ICD-10-CM | POA: Diagnosis present

## 2011-01-09 DIAGNOSIS — O34219 Maternal care for unspecified type scar from previous cesarean delivery: Principal | ICD-10-CM | POA: Diagnosis present

## 2011-01-09 LAB — CBC
HCT: 34.2 % — ABNORMAL LOW (ref 36.0–46.0)
Hemoglobin: 11.5 g/dL — ABNORMAL LOW (ref 12.0–15.0)
MCH: 30.3 pg (ref 26.0–34.0)
MCHC: 33.6 g/dL (ref 30.0–36.0)
RBC: 3.79 MIL/uL — ABNORMAL LOW (ref 3.87–5.11)

## 2011-01-09 LAB — RPR: RPR Ser Ql: NONREACTIVE

## 2011-01-09 SURGERY — Surgical Case
Anesthesia: Regional | Site: Uterus | Wound class: Clean Contaminated

## 2011-01-09 MED ORDER — FERROUS SULFATE 325 (65 FE) MG PO TABS
325.0000 mg | ORAL_TABLET | Freq: Two times a day (BID) | ORAL | Status: DC
  Administered 2011-01-11 – 2011-01-12 (×3): 325 mg via ORAL
  Filled 2011-01-09 (×3): qty 1

## 2011-01-09 MED ORDER — KETOROLAC TROMETHAMINE 30 MG/ML IJ SOLN
30.0000 mg | Freq: Four times a day (QID) | INTRAMUSCULAR | Status: AC | PRN
  Administered 2011-01-10: 30 mg via INTRAVENOUS
  Filled 2011-01-09: qty 1

## 2011-01-09 MED ORDER — HYDROMORPHONE HCL 1 MG/ML IJ SOLN
INTRAMUSCULAR | Status: AC
  Administered 2011-01-09: 0.5 mg via INTRAVENOUS
  Filled 2011-01-09: qty 1

## 2011-01-09 MED ORDER — DIPHENHYDRAMINE HCL 50 MG/ML IJ SOLN
12.5000 mg | INTRAMUSCULAR | Status: DC | PRN
  Administered 2011-01-10: 50 mg via INTRAVENOUS
  Filled 2011-01-09: qty 1

## 2011-01-09 MED ORDER — OXYTOCIN 20 UNITS IN LACTATED RINGERS INFUSION - SIMPLE
125.0000 mL/h | INTRAVENOUS | Status: AC
  Administered 2011-01-09 – 2011-01-10 (×2): 125 mL/h via INTRAVENOUS
  Filled 2011-01-09 (×2): qty 1000

## 2011-01-09 MED ORDER — ONDANSETRON HCL 4 MG/2ML IJ SOLN: 4.0000 mg | INTRAMUSCULAR | Status: DC | PRN

## 2011-01-09 MED ORDER — SIMETHICONE 80 MG PO CHEW: 80.0000 mg | CHEWABLE_TABLET | ORAL | Status: DC | PRN

## 2011-01-09 MED ORDER — OXYTOCIN 10 UNIT/ML IJ SOLN
INTRAMUSCULAR | Status: AC
  Filled 2011-01-09: qty 2

## 2011-01-09 MED ORDER — KETOROLAC TROMETHAMINE 60 MG/2ML IM SOLN
60.0000 mg | Freq: Once | INTRAMUSCULAR | Status: AC | PRN
  Filled 2011-01-09: qty 2

## 2011-01-09 MED ORDER — SENNOSIDES-DOCUSATE SODIUM 8.6-50 MG PO TABS
2.0000 | ORAL_TABLET | Freq: Every day | ORAL | Status: DC
  Administered 2011-01-10 – 2011-01-11 (×2): 2 via ORAL

## 2011-01-09 MED ORDER — HYDROMORPHONE HCL 1 MG/ML IJ SOLN
0.2500 mg | INTRAMUSCULAR | Status: DC | PRN
  Administered 2011-01-09 (×2): 0.5 mg via INTRAVENOUS

## 2011-01-09 MED ORDER — SIMETHICONE 80 MG PO CHEW
80.0000 mg | CHEWABLE_TABLET | Freq: Three times a day (TID) | ORAL | Status: DC
  Administered 2011-01-10 – 2011-01-12 (×9): 80 mg via ORAL

## 2011-01-09 MED ORDER — NALBUPHINE HCL 10 MG/ML IJ SOLN
5.0000 mg | INTRAMUSCULAR | Status: DC | PRN
  Administered 2011-01-09: 10 mg via SUBCUTANEOUS
  Administered 2011-01-10: 5 mg via SUBCUTANEOUS
  Administered 2011-01-10: 10 mg via SUBCUTANEOUS
  Filled 2011-01-09: qty 1

## 2011-01-09 MED ORDER — SODIUM CHLORIDE 0.9 % IV SOLN: 1.0000 ug/kg/h | INTRAVENOUS | Status: DC | PRN

## 2011-01-09 MED ORDER — OXYCODONE-ACETAMINOPHEN 5-325 MG PO TABS
1.0000 | ORAL_TABLET | ORAL | Status: DC | PRN
  Administered 2011-01-10 – 2011-01-12 (×6): 1 via ORAL
  Administered 2011-01-12: 2 via ORAL
  Filled 2011-01-09 (×2): qty 1
  Filled 2011-01-09: qty 2
  Filled 2011-01-09 (×5): qty 1

## 2011-01-09 MED ORDER — DIPHENHYDRAMINE HCL 50 MG/ML IJ SOLN: 25.0000 mg | INTRAMUSCULAR | Status: DC | PRN

## 2011-01-09 MED ORDER — DIPHENHYDRAMINE HCL 25 MG PO CAPS: 25.0000 mg | ORAL_CAPSULE | ORAL | Status: DC | PRN

## 2011-01-09 MED ORDER — CITRIC ACID-SODIUM CITRATE 334-500 MG/5ML PO SOLN
30.0000 mL | ORAL | Status: DC | PRN
  Administered 2011-01-09: 30 mL via ORAL
  Filled 2011-01-09: qty 15

## 2011-01-09 MED ORDER — PHENYLEPHRINE HCL 10 MG/ML IJ SOLN
INTRAMUSCULAR | Status: DC | PRN
  Administered 2011-01-09: 80 ug via INTRAVENOUS
  Administered 2011-01-09: 40 ug via INTRAVENOUS
  Administered 2011-01-09 (×7): 80 ug via INTRAVENOUS

## 2011-01-09 MED ORDER — MENTHOL 3 MG MT LOZG: 1.0000 | LOZENGE | OROMUCOSAL | Status: DC | PRN

## 2011-01-09 MED ORDER — KETOROLAC TROMETHAMINE 30 MG/ML IJ SOLN
INTRAMUSCULAR | Status: AC
  Administered 2011-01-09: 30 mg via INTRAVENOUS
  Filled 2011-01-09: qty 1

## 2011-01-09 MED ORDER — MORPHINE SULFATE (PF) 0.5 MG/ML IJ SOLN
INTRAMUSCULAR | Status: DC | PRN
  Administered 2011-01-09: 200 ug via INTRATHECAL

## 2011-01-09 MED ORDER — OXYTOCIN 20 UNITS IN LACTATED RINGERS INFUSION - SIMPLE
INTRAVENOUS | Status: DC | PRN
  Administered 2011-01-09: 20 [IU] via INTRAVENOUS

## 2011-01-09 MED ORDER — LANOLIN HYDROUS EX OINT: 1.0000 "application " | TOPICAL_OINTMENT | CUTANEOUS | Status: DC | PRN

## 2011-01-09 MED ORDER — KETOROLAC TROMETHAMINE 30 MG/ML IJ SOLN
INTRAMUSCULAR | Status: AC
  Administered 2011-01-10: 30 mg via INTRAVENOUS
  Filled 2011-01-09: qty 1

## 2011-01-09 MED ORDER — IBUPROFEN 600 MG PO TABS
600.0000 mg | ORAL_TABLET | Freq: Four times a day (QID) | ORAL | Status: DC
  Administered 2011-01-10 – 2011-01-12 (×8): 600 mg via ORAL
  Filled 2011-01-09 (×9): qty 1

## 2011-01-09 MED ORDER — NALBUPHINE HCL 10 MG/ML IJ SOLN
5.0000 mg | INTRAMUSCULAR | Status: DC | PRN
  Filled 2011-01-09: qty 1

## 2011-01-09 MED ORDER — KETOROLAC TROMETHAMINE 30 MG/ML IJ SOLN: 30.0000 mg | Freq: Four times a day (QID) | INTRAMUSCULAR | Status: AC | PRN

## 2011-01-09 MED ORDER — MEPERIDINE HCL 25 MG/ML IJ SOLN: 6.2500 mg | INTRAMUSCULAR | Status: DC | PRN

## 2011-01-09 MED ORDER — PHENYLEPHRINE 40 MCG/ML (10ML) SYRINGE FOR IV PUSH (FOR BLOOD PRESSURE SUPPORT)
PREFILLED_SYRINGE | INTRAVENOUS | Status: AC
  Filled 2011-01-09: qty 5

## 2011-01-09 MED ORDER — PRENATAL PLUS 27-1 MG PO TABS
1.0000 | ORAL_TABLET | Freq: Every day | ORAL | Status: DC
  Administered 2011-01-11 – 2011-01-12 (×2): 1 via ORAL
  Filled 2011-01-09 (×2): qty 1

## 2011-01-09 MED ORDER — BUPIVACAINE HCL (PF) 0.25 % IJ SOLN
INTRAMUSCULAR | Status: DC | PRN
  Administered 2011-01-09: 20 mL

## 2011-01-09 MED ORDER — METHYLERGONOVINE MALEATE 0.2 MG/ML IJ SOLN: 0.2000 mg | INTRAMUSCULAR | Status: DC | PRN

## 2011-01-09 MED ORDER — FENTANYL CITRATE 0.05 MG/ML IJ SOLN
INTRAMUSCULAR | Status: DC | PRN
  Administered 2011-01-09: 25 ug via INTRATHECAL

## 2011-01-09 MED ORDER — ONDANSETRON HCL 4 MG PO TABS: 4.0000 mg | ORAL_TABLET | ORAL | Status: DC | PRN

## 2011-01-09 MED ORDER — KETOROLAC TROMETHAMINE 30 MG/ML IJ SOLN
15.0000 mg | Freq: Once | INTRAMUSCULAR | Status: AC | PRN
  Administered 2011-01-09: 30 mg via INTRAVENOUS

## 2011-01-09 MED ORDER — IBUPROFEN 600 MG PO TABS: 600.0000 mg | ORAL_TABLET | Freq: Four times a day (QID) | ORAL | Status: DC | PRN

## 2011-01-09 MED ORDER — ZOLPIDEM TARTRATE 5 MG PO TABS: 5.0000 mg | ORAL_TABLET | Freq: Every evening | ORAL | Status: DC | PRN

## 2011-01-09 MED ORDER — SCOPOLAMINE 1 MG/3DAYS TD PT72: 1.0000 | MEDICATED_PATCH | Freq: Once | TRANSDERMAL | Status: DC

## 2011-01-09 MED ORDER — LACTATED RINGERS IV SOLN
INTRAVENOUS | Status: DC
  Administered 2011-01-09 (×3): via INTRAVENOUS

## 2011-01-09 MED ORDER — FENTANYL CITRATE 0.05 MG/ML IJ SOLN
INTRAMUSCULAR | Status: AC
  Filled 2011-01-09: qty 2

## 2011-01-09 MED ORDER — BUPIVACAINE IN DEXTROSE 0.75-8.25 % IT SOLN
INTRATHECAL | Status: DC | PRN
  Administered 2011-01-09: 1.6 mL via INTRATHECAL

## 2011-01-09 MED ORDER — MORPHINE SULFATE 0.5 MG/ML IJ SOLN
INTRAMUSCULAR | Status: AC
  Filled 2011-01-09: qty 10

## 2011-01-09 MED ORDER — FENTANYL CITRATE 0.05 MG/ML IJ SOLN
INTRAMUSCULAR | Status: DC | PRN
  Administered 2011-01-09: 50 ug via INTRAVENOUS
  Administered 2011-01-09: 25 ug via INTRAVENOUS

## 2011-01-09 MED ORDER — ONDANSETRON HCL 4 MG/2ML IJ SOLN
INTRAMUSCULAR | Status: AC
  Filled 2011-01-09: qty 2

## 2011-01-09 MED ORDER — ONDANSETRON HCL 4 MG/2ML IJ SOLN
INTRAMUSCULAR | Status: DC | PRN
  Administered 2011-01-09: 4 mg via INTRAVENOUS

## 2011-01-09 MED ORDER — DIPHENHYDRAMINE HCL 25 MG PO CAPS: 25.0000 mg | ORAL_CAPSULE | Freq: Four times a day (QID) | ORAL | Status: DC | PRN

## 2011-01-09 MED ORDER — DEXAMETHASONE SODIUM PHOSPHATE 4 MG/ML IJ SOLN
8.0000 mg | Freq: Once | INTRAMUSCULAR | Status: DC | PRN
Start: 2011-01-09 — End: 2011-01-09

## 2011-01-09 MED ORDER — ONDANSETRON HCL 4 MG/2ML IJ SOLN: 4.0000 mg | Freq: Three times a day (TID) | INTRAMUSCULAR | Status: DC | PRN

## 2011-01-09 MED ORDER — TETANUS-DIPHTH-ACELL PERTUSSIS 5-2.5-18.5 LF-MCG/0.5 IM SUSP: 0.5000 mL | Freq: Once | INTRAMUSCULAR | Status: DC

## 2011-01-09 MED ORDER — NALOXONE HCL 0.4 MG/ML IJ SOLN: 0.4000 mg | INTRAMUSCULAR | Status: DC | PRN

## 2011-01-09 MED ORDER — DIBUCAINE 1 % RE OINT: 1.0000 "application " | TOPICAL_OINTMENT | RECTAL | Status: DC | PRN

## 2011-01-09 MED ORDER — METOCLOPRAMIDE HCL 5 MG/ML IJ SOLN: 10.0000 mg | Freq: Three times a day (TID) | INTRAMUSCULAR | Status: DC | PRN

## 2011-01-09 MED ORDER — FENTANYL CITRATE 0.05 MG/ML IJ SOLN
INTRAMUSCULAR | Status: AC
Start: 2011-01-09 — End: 2011-01-09
  Filled 2011-01-09: qty 2

## 2011-01-09 MED ORDER — METHYLERGONOVINE MALEATE 0.2 MG PO TABS: 0.2000 mg | ORAL_TABLET | ORAL | Status: DC | PRN

## 2011-01-09 MED ORDER — WITCH HAZEL-GLYCERIN EX PADS: 1.0000 "application " | MEDICATED_PAD | CUTANEOUS | Status: DC | PRN

## 2011-01-09 MED ORDER — CEFAZOLIN SODIUM-DEXTROSE 2-3 GM-% IV SOLR
2.0000 g | INTRAVENOUS | Status: AC
  Administered 2011-01-09 (×2): 2 g via INTRAVENOUS
  Filled 2011-01-09: qty 50

## 2011-01-09 MED ORDER — SODIUM CHLORIDE 0.9 % IJ SOLN: 3.0000 mL | INTRAMUSCULAR | Status: DC | PRN

## 2011-01-09 SURGICAL SUPPLY — 33 items
BENZOIN TINCTURE PRP APPL 2/3 (GAUZE/BANDAGES/DRESSINGS) ×2 IMPLANT
BOOTIES KNEE HIGH SLOAN (MISCELLANEOUS) ×4 IMPLANT
CHLORAPREP W/TINT 26ML (MISCELLANEOUS) ×2 IMPLANT
DRAIN JACKSON PRT FLT 10 (DRAIN) ×2 IMPLANT
DRESSING TELFA 8X3 (GAUZE/BANDAGES/DRESSINGS) ×2 IMPLANT
ELECT REM PT RETURN 9FT ADLT (ELECTROSURGICAL) ×2
ELECTRODE REM PT RTRN 9FT ADLT (ELECTROSURGICAL) ×1 IMPLANT
EVACUATOR SILICONE 100CC (DRAIN) ×2 IMPLANT
EXTRACTOR VACUUM M CUP 4 TUBE (SUCTIONS) IMPLANT
GAUZE SPONGE 4X4 12PLY STRL LF (GAUZE/BANDAGES/DRESSINGS) ×2 IMPLANT
GLOVE BIOGEL PI IND STRL 7.0 (GLOVE) ×2 IMPLANT
GLOVE BIOGEL PI INDICATOR 7.0 (GLOVE) ×2
GLOVE ECLIPSE 6.5 STRL STRAW (GLOVE) ×2 IMPLANT
GOWN PREVENTION PLUS LG XLONG (DISPOSABLE) ×6 IMPLANT
KIT ABG SYR 3ML LUER SLIP (SYRINGE) IMPLANT
NEEDLE HYPO 22GX1.5 SAFETY (NEEDLE) ×2 IMPLANT
NEEDLE HYPO 25X5/8 SAFETYGLIDE (NEEDLE) IMPLANT
NS IRRIG 1000ML POUR BTL (IV SOLUTION) ×4 IMPLANT
PACK C SECTION WH (CUSTOM PROCEDURE TRAY) ×2 IMPLANT
PAD ABD 7.5X8 STRL (GAUZE/BANDAGES/DRESSINGS) ×2 IMPLANT
RTRCTR C-SECT PINK 25CM LRG (MISCELLANEOUS) ×2 IMPLANT
SLEEVE SCD COMPRESS KNEE MED (MISCELLANEOUS) ×2 IMPLANT
STRIP CLOSURE SKIN 1/2X4 (GAUZE/BANDAGES/DRESSINGS) ×2 IMPLANT
SUT CHROMIC GUT AB #0 18 (SUTURE) IMPLANT
SUT MNCRL AB 3-0 PS2 27 (SUTURE) ×2 IMPLANT
SUT SILK 2 0 FSL 18 (SUTURE) IMPLANT
SUT VIC AB 0 CTX 36 (SUTURE) ×2
SUT VIC AB 0 CTX36XBRD ANBCTRL (SUTURE) ×2 IMPLANT
SUT VIC AB 1 CT1 36 (SUTURE) ×4 IMPLANT
SYR 20CC LL (SYRINGE) ×2 IMPLANT
TOWEL OR 17X24 6PK STRL BLUE (TOWEL DISPOSABLE) ×4 IMPLANT
TRAY FOLEY CATH 14FR (SET/KITS/TRAYS/PACK) ×2 IMPLANT
WATER STERILE IRR 1000ML POUR (IV SOLUTION) ×2 IMPLANT

## 2011-01-09 NOTE — Anesthesia Postprocedure Evaluation (Signed)
Anesthesia Post Note  Patient: Laura Tucker  Procedure(s) Performed:  CESAREAN SECTION  Anesthesia type: Spinal  Patient location: PACU  Post pain: Pain level controlled  Post assessment: Post-op Vital signs reviewed  Last Vitals:  Filed Vitals:   01/09/11 1930  BP: 106/70  Pulse:   Temp:   Resp:     Post vital signs: Reviewed  Level of consciousness: awake  Complications: No apparent anesthesia complications

## 2011-01-09 NOTE — Op Note (Signed)
Preoperative diagnosis: Intrauterine pregnancy at 36 weeks and 0 days, isoimmunization with anti-c, abnormal MCA dopplers with suspicion of moderate fetal anemia   Post operative diagnosis: Same  Anesthesia: Spinal  Anesthesiologist: Dr. Arby Barrette  Procedure: Repeat  low transverse cesarean section  Surgeon: Dr. Dois Davenport Gibson Telleria  Assistant: Almond Lint CNM  Estimated blood loss: 600 cc  Brief History:   This is a patient seen in the office today at 36 weeks with abnormal MCA dopplers at 1.48 MOM c/w moderate fetal anemia. Her Anti-c titers have been followed monthly and remained at 1:4 until 10 days ago where we noted a rise to 1:16. Following MFM recommendations, weekly MCA dopplers were planned. Antibody titers drawn 01/04/11 increased to 1:64. Otherwise, ultrasound study revealed an appropriately grown fetus with normal AFI and BPP 8/8. These findings were discussed with Dr Otho Perl who suggested tighter fetal surveillance with delivery at 37 weeks. When the history of a previous IUFD was reviewed, Dr Otho Perl agreed with the plan of proceeding with immediate cesarean delivery. The patient was informed of her options and preferred to proceed with immediate delivery understanding the possibility of a false positive Doppler study and the possibility of neonatal transitioning issues due to late prematurity.  Procedure:  After being informed of the planned procedure and possible complications including bleeding, infection, injury to other organs, informed consent is obtained. The patient is taken to OR #1 and given spinal anesthesia without complication. She is placed in the dorsal decubitus position with the pelvis tilted to the left. She is then prepped and draped in a sterile fashion. A Foley catheter is inserted in her bladder.  After assessing adequate level of anesthesia, we infiltrate the suprapubic area with 20 cc of Marcaine 0.25 and perform a Pfannenstiel incision which is brought down  sharply to the fascia. The fascia is entered in a low transverse fashion. Linea alba is dissected. Peritoneum is entered in a midline fashion. An Alexis retractor is easily positioned. Visceral peritoneum is entered in a low transverse fashion allowing [place] to safely retract bladder by developing a bladder flap.  The myometrium is then entered in a low transverse fashion; first with knife and then extended bluntly. Amniotic fluid is clear. We assist the birth of a female  infant in vertex presentation. Mouth and nose are suctioned. The baby is delivered. The cord is clamped and sectioned. The baby is given to the neonatologist present in the room.  10 cc of blood is drawn from the umbilical vein.The placenta is allowed to deliver spontaneously. It is complete and the cord has 3 vessels. Uterine revision is negative.  We proceed with closure of the myometrium in 2 layers: First with a running locked suture of 0 Vicryl, then with a Lembert suture of 0 Vicryl imbricating the first one. Hemostasis is completed with cauterization on peritoneal edges.  Both paracolic gutters are cleaned. Both tubes and ovaries are assessed and normal. The pelvis is profusely irrigated with warm saline to confirm a satisfactory hemostasis.  Retractors and sponges are removed. Under fascia hemostasis is completed with cauterization. The fascia is then closed with 2 running sutures of 0 Vicryl meeting midline. The wound is irrigated with warm saline and hemostasis is completed with cauterization. The skin is closed with a subcuticular suture of 3-0 Monocryl and Steri-Strips.  Instrument and sponge count is complete x2. Estimated blood loss is 600 cc.  The procedure is well tolerated by the patient who is taken to recovery room in a well  and stable condition.  female baby named Lysbeth Galas III was born at 18:03 and received an Apgar of 9  at 1 minute and 9 at 5 minutes. Weight was 7 lbs  3 oz.    Specimen: Placenta sent to  pathology   [facility] A MD 10/23/20127:17 PM

## 2011-01-09 NOTE — Anesthesia Preprocedure Evaluation (Signed)
Anesthesia Evaluation  Patient identified by MRN, date of birth, ID band Patient awake  General Assessment Comment  Reviewed: Allergy & Precautions, H&P , Patient's Chart, lab work & pertinent test results  Airway Mallampati: III TM Distance: >3 FB Neck ROM: full    Dental No notable dental hx.    Pulmonary  clear to auscultation  Pulmonary exam normal       Cardiovascular hypertension,     Neuro/Psych Negative Psych ROS   GI/Hepatic Neg liver ROS  GERD   Endo/Other  Negative Endocrine ROS  Renal/GU negative Renal ROS  Genitourinary negative   Musculoskeletal   Abdominal (+) obese,   Peds negative pediatric ROS (+)  Hematology negative hematology ROS (+)   Anesthesia Other Findings   Reproductive/Obstetrics (+) Pregnancy                           Anesthesia Physical Anesthesia Plan  ASA: III  Anesthesia Plan: Spinal   Post-op Pain Management:    Induction:   Airway Management Planned:   Additional Equipment:   Intra-op Plan:   Post-operative Plan:   Informed Consent: I have reviewed the patients History and Physical, chart, labs and discussed the procedure including the risks, benefits and alternatives for the proposed anesthesia with the patient or authorized representative who has indicated his/her understanding and acceptance.     Plan Discussed with: CRNA  Anesthesia Plan Comments:         Anesthesia Quick Evaluation

## 2011-01-09 NOTE — Interval H&P Note (Signed)
History and Physical Interval Note:   01/09/2011   5:17 PM   Laura Tucker  has presented today for surgery, with the diagnosis of Repeat LT C/S  The various methods of treatment have been discussed with the patient and family. After consideration of risks, benefits and other options for treatment, the patient has consented to  Procedure(s): CESAREAN SECTION as a surgical intervention .  I have reviewed the patients' chart and labs.  Questions were answered to the patient's satisfaction.     Silverio Lay A  MD

## 2011-01-09 NOTE — Anesthesia Procedure Notes (Addendum)
Spinal Block  Patient location during procedure: OR Start time: 01/09/2011 5:22 PM End time: 01/09/2011 5:33 PM Staffing Anesthesiologist: Sandrea Hughs Performed by: anesthesiologist  Preanesthetic Checklist Completed: patient identified, site marked, surgical consent, pre-op evaluation, timeout performed, IV checked, risks and benefits discussed and monitors and equipment checked Spinal Block Patient position: sitting Prep: DuraPrep Patient monitoring: heart rate, cardiac monitor, continuous pulse ox and blood pressure Approach: midline Location: L3-4 Injection technique: single-shot Needle Needle type: Sprotte  Needle gauge: 24 G Needle length: 9 cm Needle insertion depth: 7 cm Assessment Sensory level: T4

## 2011-01-09 NOTE — Transfer of Care (Signed)
Immediate Anesthesia Transfer of Care Note  Patient: Laura Tucker  Procedure(s) Performed:  CESAREAN SECTION  Patient Location: PACU  Anesthesia Type: Spinal  Level of Consciousness: awake  Airway & Oxygen Therapy: Patient Spontanous Breathing  Post-op Assessment: Report given to PACU RN and Post -op Vital signs reviewed and stable  Post vital signs: Reviewed and stable  Complications: No apparent anesthesia complications

## 2011-01-09 NOTE — Brief Op Note (Signed)
   01/09/2011  6:44 PM  PATIENT:  Laura Tucker  30 y.o. female  PRE-OPERATIVE DIAGNOSIS:  Intrauterine Pregnancy @ 36weks; Iso-Immunization with Anti-c; Abnormal MCA Doppler; suspected Moderate Fetal Anemia  POST-OPERATIVE DIAGNOSIS:  Intrauterine Pregnancy @ 36weks; Iso-Immunization with Anti-c; Abnormal MCA Doppler;  Suspected Moderate Fetal Anemia  PROCEDURE:  Procedure(s): CESAREAN SECTION  SURGEON:  Surgeon(s): Esmeralda Arthur, MD MD  ASSISTANTS: Almond Lint CNM   ANESTHESIA:   local and spinal  LOCAL MEDICATIONS USED:  MARCAINE 20 CC  SPECIMEN:  Placenta  DISPOSITION OF SPECIMEN:  PATHOLOGY  COUNTS:  YES  ESTIMATED BLOOD LOSS: 600 cc  PATIENT DISPOSITION:  PACU - hemodynamically stable.       ZHYQMV,HQIONG A MD 01/09/2011 6:44 PM

## 2011-01-09 NOTE — H&P (Signed)
Laura Tucker is a 30 y.o. female presenting for scheduled repeat LTCS, secondary to increased MCA doppler today C/W mod anemia. Pt is a G3P2001 at 36wks. She presented for prenatal care at [facility] and has been followed by the MD service. Pregnancy significant for: 1. Hx 37wk IUFD - abruption/pre-eclampsia 2. Hx LTCS x2 3. Anti-C antibodies  Maternal Medical History:  Contractions: Onset was 6-12 hours ago.   Frequency: irregular.   Duration is approximately 40 seconds.   Perceived severity is mild.    Fetal activity: Perceived fetal activity is normal.   Last perceived fetal movement was within the past hour.    Prenatal complications: Anti C antibody screen    OB History    Grav Para Term Preterm Abortions TAB SAB Ect Mult Living   3 2 1 1      1      Past Medical History  Diagnosis Date  . 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 Pregnancy induced hypertension   . Kidney stones   . Asthma   . Carpal tunnel syndrome of right wrist   . Headache   . GERD (gastroesophageal reflux disease)    Past Surgical History  Procedure Date  . Cesarean section   . Lithotripsy     stint and stone removal ( ureter kinked)   Family History: family history is not on file.CHTN - mom, dad, mgm: Varicosities - dad, mgm: Diabetes - mom: Cancer - mom - lung:  Social History:  reports that she has never smoked. She has never used smokeless tobacco. She reports that she does not drink alcohol or use illicit drugs. Pt is married and has a high-school education and works full-time as an Advertising account planner. She is white and does not have a religious affiliation.   Review of Systems  Gastrointestinal: Positive for abdominal pain.       C/O ctx  Genitourinary:       Vaginal pressure  All other systems reviewed and are negative.        There were no vitals taken for this visit. BP 125/80 HR 103 RR 20   Maternal Exam:  Uterine Assessment: Contraction strength is mild.  Contraction duration is 40 seconds. Contraction frequency is irregular.   Abdomen: Surgical scars: low transverse.   Estimated fetal weight is 6#13oz per [place].   Fetal presentation: vertex  Introitus: not evaluated.   Cervix: not evaluated.   Fetal Exam Fetal Monitor Review: Mode: ultrasound.   Baseline rate: 140.  Variability: moderate (6-25 bpm).   Pattern: accelerations present and no decelerations.    Fetal State Assessment: Category I - tracings are normal.     Physical Exam  Nursing note and vitals reviewed. Constitutional: She is oriented to person, place, and time. She appears well-developed and well-nourished.  Neck: Normal range of motion.  Cardiovascular: Normal rate, regular rhythm and normal heart sounds.   Respiratory: Effort normal and breath sounds normal.  GI: Soft. Bowel sounds are normal. She exhibits no distension. There is no tenderness.  Musculoskeletal: Normal range of motion. She exhibits no edema.  Neurological: She is alert and oriented to person, place, and time. She has normal reflexes.  Skin: Skin is warm and dry.  Psychiatric: She has a normal mood and affect. Her behavior is normal. Judgment and thought content normal.  Prenatal labs: ABO, Rh:  O pos Antibody:  initially neg - anti - C pos, last titer 1:4 Rubella:  imm RPR:   NR HBsAg:   neg HIV:   neg GBS:   neg 1hr GTT - 92  Assessment/Plan: IUP at 36wks Anti - C antibody Increased MCA doppler today EFW 6#13oz 82% AFI nl BPP 8/8 Hx C/S x2  Admit to pre-op Routine C-Section orders per Dr. Pura Spice 01/09/2011, 3:10 PM

## 2011-01-10 ENCOUNTER — Encounter (HOSPITAL_COMMUNITY): Payer: Self-pay | Admitting: Obstetrics and Gynecology

## 2011-01-10 LAB — CBC
HCT: 29.5 % — ABNORMAL LOW (ref 36.0–46.0)
MCHC: 33.2 g/dL (ref 30.0–36.0)
MCV: 90.5 fL (ref 78.0–100.0)
Platelets: 196 10*3/uL (ref 150–400)
RDW: 14.9 % (ref 11.5–15.5)

## 2011-01-10 NOTE — Addendum Note (Signed)
Addendum  created 01/10/11 0804 by Doreene Burke   Modules edited:Notes Section

## 2011-01-10 NOTE — Anesthesia Postprocedure Evaluation (Signed)
Anesthesia Post Note  Patient: Laura Tucker  Procedure(s) Performed:  CESAREAN SECTION  Anesthesia type: SAB  Patient location: Mother/Baby  Post pain: Pain level controlled  Post assessment: Post-op Vital signs reviewed  Last Vitals:  Filed Vitals:   01/10/11 0530  BP: 111/72  Pulse: 96  Temp: 36.9 C  Resp: 20    Post vital signs: Reviewed  Level of consciousness: awake  Complications: No apparent anesthesia complications

## 2011-01-10 NOTE — Progress Notes (Addendum)
Subjective: Postpartum Day 1: Cesarean Delivery Patient reports itching from spinal meds--has received Benadryl and Nubain x 2.  Infant at bedside under bili lights.  Patient plans breast and bottle feeding.  Pumped last night with 30 cc produced.    Objective: Vital signs in last 24 hours: Temp:  [97.5 F (36.4 C)-98.5 F (36.9 C)] 97.8 F (36.6 C) (10/24 0812) Pulse Rate:  [74-103] 81  (10/24 0812) Resp:  [15-31] 18  (10/24 0812) BP: (96-125)/(57-80) 107/70 mmHg (10/24 0812) SpO2:  [95 %-100 %] 97 % (10/24 0812) Weight:  [112.946 kg (249 lb)] 249 lb (112.946 kg) (10/23 1542)  Physical Exam:  General: alert Lochia: appropriate Uterine Fundus: firm Incision: Dressing with very small amount dried drainage on lower border of pressure dressing. DVT Evaluation: No evidence of DVT seen on physical exam. Foley draining clear urine. Negative Homan's sign. JP drain with minimal drainage overnight.   Basename 01/10/11 0515 01/09/11 1600  HGB 9.8* 11.5*  HCT 29.5* 34.2*    Assessment/Plan: Status post Cesarean section. Doing well postoperatively.  Continue current care. Support for breastfeeding/pumping.  Ayeisha Lindenberger 01/10/2011, 9:28 AM   Agree with above - AYR

## 2011-01-11 NOTE — Progress Notes (Signed)
Subjective: Postpartum Day 2: Cesarean Delivery Patient reports no problems voiding.  Up in bathroom, reports doing well.  Infant remains under bili lights, but bili levels are decreasing.    Objective: Vital signs in last 24 hours: Temp:  [97.6 F (36.4 C)-98.2 F (36.8 C)] 97.7 F (36.5 C) (10/25 0620) Pulse Rate:  [81-101] 96  (10/25 0620) Resp:  [18-22] 22  (10/25 0620) BP: (96-130)/(66-83) 129/69 mmHg (10/25 0620) SpO2:  [96 %-97 %] 96 % (10/24 1825)  Physical Exam:  General: alert Lochia: appropriate Uterine Fundus: firm Incision: healing well DVT Evaluation: No evidence of DVT seen on physical exam. Negative Homan's sign.   Basename 01/10/11 0515 01/09/11 1600  HGB 9.8* 11.5*  HCT 29.5* 34.2*    Assessment/Plan: Status post Cesarean section. Doing well postoperatively.  Continue current care. Anticipate d/c tomorrow.  [name] 01/11/2011, 7:54 AM

## 2011-01-12 MED ORDER — IBUPROFEN 600 MG PO TABS: 600.0000 mg | ORAL_TABLET | Freq: Four times a day (QID) | ORAL | Status: AC | PRN

## 2011-01-12 MED ORDER — OXYCODONE-ACETAMINOPHEN 5-325 MG PO TABS: 1.0000 | ORAL_TABLET | ORAL | Status: AC | PRN

## 2011-01-12 NOTE — Discharge Summary (Signed)
Obstetric Discharge Summary Reason for Admission: cesarean section due to repeat, isoimmunization, and abnormal doppler studies Prenatal Procedures: NST, [place] Intrapartum Procedures: cesarean: low cervical, transverse, Placement of JP drain Postpartum Procedures: Removal of JP drain Complications-Operative and Postpartum: none Hemoglobin  Date Value Range Status  01/10/2011 9.8* 12.0-15.0 (g/dL) Final     HCT  Date Value Range Status  01/10/2011 29.5* 36.0-46.0 (%) Final   Hospital Course: Patient admitted 01/09/11 for increasing antibody titers and abnormal MCA dopplers, with plan for repeat C/S performed by Dr. Estanislado Pandy.  Patient tolerated the procedure well.  Infant remained in the term nursery, but did require bili light use beginning on the day after delivery.  Patient had a JP drain that was removed on the day of discharge.  Vital signs remained stable, and the patient was discharged on 01/12/11 in good condition.  Baby was to remain under the bili lights, and the patient will stay with the baby until discharge.  Discharge Diagnoses: Term Pregnancy-delivered, isoimmunization, anemia  Discharge Information: Date: 01/12/2011 Activity: Per [facility] handout Diet: routine Medications: Ibuprofen and Percocet Condition: stable Instructions: refer to practice specific booklet Discharge to: home Follow-up Information    Follow up with [facility] A, MD.   Contact information:   [address] [place] [address] [phone]          Newborn Data: Live born female  Birth Weight: 7 lb 3 oz (3260 g) APGAR: 9, 9  Infant remained under bili lights after mother discharged.  Anticipate additional 1-2 day stay.   Medications:  Medication List  As of 01/12/2011  9:25 AM   START taking these medications         ibuprofen 600 MG tablet   Commonly known as: ADVIL,MOTRIN   Take 1 tablet (600 mg total) by mouth every 6 (six) hours as needed for pain.     oxyCODONE-acetaminophen 5-325 MG per tablet   Commonly known as: PERCOCET   Take 1-2 tablets by mouth every 4 (four) hours as needed (moderate - severe pain).         CONTINUE taking these medications         cetirizine 10 MG tablet   Commonly known as: ZYRTEC      FLINTSTONES PLUS IRON PO      guaiFENesin 600 MG 12 hr tablet   Commonly known as: MUCINEX      pantoprazole 40 MG tablet   Commonly known as: PROTONIX          Where to get your medications    These are the prescriptions that you need to pick up.   You may get these medications from any pharmacy.         ibuprofen 600 MG tablet   oxyCODONE-acetaminophen 5-325 MG per tablet            Bernardine Langworthy 01/12/2011, 9:25 AM

## 2011-01-12 NOTE — Progress Notes (Signed)
Subjective: Postpartum Day 3: Cesarean Delivery Patient reports doing well.  Infant's bili is continuing to improve, but is still requiring phototherapy, and will be made baby patient.  Patient undecided regarding contraception.  Pain well controlled with po meds.  Objective: Vital signs in last 24 hours: Temp:  [97.6 F (36.4 C)-97.8 F (36.6 C)] 97.8 F (36.6 C) (10/26 0546) Pulse Rate:  [77-85] 77  (10/26 0546) Resp:  [18-20] 18  (10/26 0546) BP: (105-112)/(66-75) 112/75 mmHg (10/26 0546)  Physical Exam:  General: alert Lochia: appropriate Uterine Fundus: firm Incision: healing well.  JP drain with < 5 cc overnight, removed without difficulty. DVT Evaluation: No evidence of DVT seen on physical exam. Negative Homan's sign.   Basename 01/10/11 0515 01/09/11 1600  HGB 9.8* 11.5*  HCT 29.5* 34.2*    Assessment/Plan: Status post Cesarean section. Doing well postoperatively.  Discharge home with standard precautions and return to clinic in 4-6 weeks. Rx Motrin, Percocet. Will decide regarding contraception by 6 weeks.  [name] 01/12/2011, 9:34 AM

## 2011-01-12 NOTE — Progress Notes (Signed)
Discharge instructions and prescriptions given to pt. Baby cont. To be a pt. Mom doing well. States has good understanding of discharge instructions.

## 2011-01-15 ENCOUNTER — Encounter (HOSPITAL_COMMUNITY)
Admission: RE | Admit: 2011-01-15 | Discharge: 2011-01-15 | Disposition: A | Discharge status: Home / Self Care | Payer: BC Managed Care – PPO | Source: Ambulatory Visit | Attending: Obstetrics and Gynecology | Admitting: Obstetrics and Gynecology

## 2011-01-15 ENCOUNTER — Inpatient Hospital Stay (HOSPITAL_COMMUNITY): Admission: RE | Admit: 2011-01-15 | Payer: BC Managed Care – PPO | Source: Ambulatory Visit

## 2011-01-15 DIAGNOSIS — O923 Agalactia: Secondary | ICD-10-CM | POA: Insufficient documentation

## 2011-02-15 ENCOUNTER — Encounter (HOSPITAL_COMMUNITY)
Admission: RE | Admit: 2011-02-15 | Discharge: 2011-02-15 | Disposition: A | Discharge status: Home / Self Care | Payer: BC Managed Care – PPO | Source: Ambulatory Visit | Attending: Obstetrics and Gynecology | Admitting: Obstetrics and Gynecology

## 2011-02-15 DIAGNOSIS — O923 Agalactia: Secondary | ICD-10-CM | POA: Insufficient documentation

## 2011-03-18 ENCOUNTER — Encounter (HOSPITAL_COMMUNITY)
Admission: RE | Admit: 2011-03-18 | Discharge: 2011-03-18 | Disposition: A | Discharge status: Home / Self Care | Payer: BC Managed Care – PPO | Source: Ambulatory Visit | Attending: Obstetrics and Gynecology | Admitting: Obstetrics and Gynecology

## 2011-03-18 DIAGNOSIS — O923 Agalactia: Secondary | ICD-10-CM | POA: Insufficient documentation

## 2011-04-18 ENCOUNTER — Encounter (HOSPITAL_COMMUNITY)
Admission: RE | Admit: 2011-04-18 | Discharge: 2011-04-18 | Disposition: A | Discharge status: Home / Self Care | Payer: BC Managed Care – PPO | Source: Ambulatory Visit | Attending: Obstetrics and Gynecology | Admitting: Obstetrics and Gynecology

## 2011-04-18 DIAGNOSIS — O923 Agalactia: Secondary | ICD-10-CM | POA: Insufficient documentation

## 2011-04-23 ENCOUNTER — Emergency Department (HOSPITAL_BASED_OUTPATIENT_CLINIC_OR_DEPARTMENT_OTHER)
Admission: EM | Admit: 2011-04-23 | Discharge: 2011-04-23 | Disposition: A | Discharge status: Home / Self Care | Payer: BC Managed Care – PPO | Attending: Emergency Medicine | Admitting: Emergency Medicine

## 2011-04-23 ENCOUNTER — Encounter (HOSPITAL_BASED_OUTPATIENT_CLINIC_OR_DEPARTMENT_OTHER): Payer: Self-pay | Admitting: *Deleted

## 2011-04-23 DIAGNOSIS — K219 Gastro-esophageal reflux disease without esophagitis: Secondary | ICD-10-CM | POA: Insufficient documentation

## 2011-04-23 DIAGNOSIS — J45909 Unspecified asthma, uncomplicated: Secondary | ICD-10-CM | POA: Insufficient documentation

## 2011-04-23 DIAGNOSIS — I889 Nonspecific lymphadenitis, unspecified: Secondary | ICD-10-CM

## 2011-04-23 DIAGNOSIS — R51 Headache: Secondary | ICD-10-CM | POA: Insufficient documentation

## 2011-04-23 MED ORDER — OXYCODONE-ACETAMINOPHEN 5-325 MG PO TABS: 1.0000 | ORAL_TABLET | Freq: Four times a day (QID) | ORAL | Status: AC | PRN

## 2011-04-23 MED ORDER — SULFAMETHOXAZOLE-TRIMETHOPRIM 800-160 MG PO TABS: 1.0000 | ORAL_TABLET | Freq: Two times a day (BID) | ORAL | Status: AC

## 2011-04-23 MED ORDER — OXYCODONE-ACETAMINOPHEN 5-325 MG PO TABS
1.0000 | ORAL_TABLET | Freq: Once | ORAL | Status: AC
Start: 2011-04-23 — End: 2011-04-23
  Administered 2011-04-23: 1 via ORAL
  Filled 2011-04-23: qty 1

## 2011-04-23 NOTE — ED Provider Notes (Signed)
History   This chart was scribed for Laura Chick, MD by Melba Coon. The patient was seen in room MH05/MH05 and the patient's care was started at 12:50AM.    CSN: [id number]  Arrival date & time 04/23/11  0029   First MD Initiated Contact with Patient 04/23/11 0040      Chief Complaint  Patient presents with  . Head Injury  . Headache    (Consider location/radiation/quality/duration/timing/severity/associated sxs/prior treatment) HPI Laura Tucker is a 31 y.o. female who presents to the Emergency Department complaining of constant, throbbing, radiating, moderate to severe posterior head pain resulting from a nodule with an onset yesterday. Last week, pt noticed a knot on the back of her head. Yesterday morning, the knot started hurting and progressively got worse going into yesterday evening/night. Pt has not hit her head; no known scratches; no fever. Pain radiates from the back of the head into the ears bilaterally. Upward extension of neck and and rotation of head to the right aggravates the pain. Pt has taken ibuprofen which did not alleviate the pain. No chest, abd or extremity pain. Allergic to Augmentin and Imitrex. Pt is a mother that is breast-feeding. Family history of CA (mother).  No fever.    Past Medical History  Diagnosis Date  . 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 Pregnancy induced hypertension   . Kidney stones   . Asthma   . Carpal tunnel syndrome of right wrist   . Headache   . GERD (gastroesophageal reflux disease)     Past Surgical History  Procedure Date  . Cesarean section   . Lithotripsy     stint and stone removal ( ureter kinked)  . Cesarean section 01/09/2011    Procedure: CESAREAN SECTION;  Surgeon: Esmeralda Arthur, MD;  Location: [facility];  Service: Gynecology;  Laterality: N/A;     Family History  Problem Relation Age of Onset  . Cancer Mother   . Depression Mother   . Hypertension Mother   . Hypertension Father   . Depression Maternal Grandmother   . Hypertension Maternal Grandmother     History  Substance Use Topics  . Smoking status: Never Smoker   . Smokeless tobacco: Never Used  . Alcohol Use: No    OB History    Grav Para Term Preterm Abortions TAB SAB Ect Mult Living   3 3 1 2      2       Review of Systems 10 Systems reviewed and are negative for acute change except as noted in the HPI.  Allergies  Augmentin and Imitrex  Home Medications   Current Outpatient Rx  Name Route Sig Dispense Refill  . BUPROPION HCL ER (XL) 300 MG PO TB24 Oral Take 300 mg by mouth daily.    Marland Kitchen CETIRIZINE HCL 10 MG PO TABS Oral Take 10 mg by mouth daily.     . GUAIFENESIN ER 600 MG PO TB12 Oral Take 600-1,200 mg by mouth 2 (two) times daily as needed. For cough/cold     . OXYCODONE-ACETAMINOPHEN 5-325 MG PO TABS Oral Take 1-2 tablets by mouth every 6 (six) hours as needed for pain. 15 tablet 0  . PANTOPRAZOLE SODIUM 40 MG PO TBEC Oral Take 40 mg by mouth daily.     Marland Kitchen FLINTSTONES PLUS IRON PO Oral Take 1 tablet by  mouth daily.      . SULFAMETHOXAZOLE-TRIMETHOPRIM 800-160 MG PO TABS Oral Take 1 tablet by mouth 2 (two) times daily. 28 tablet 0    BP 120/65  Pulse 96  Temp(Src) 98.1 F (36.7 C) (Oral)  Resp 20  SpO2 100%  Breastfeeding? Yes  Physical Exam  Nursing note and vitals reviewed. Constitutional: She appears well-developed and well-nourished. She appears distressed (crying).       Awake, alert, nontoxic appearance.  HENT:  Head: Normocephalic and atraumatic.  Eyes: EOM are normal. Pupils are equal, round, and reactive to light. Right eye exhibits no discharge. Left eye exhibits no discharge.  Neck: Normal range of motion. Neck supple.  Cardiovascular: Normal rate, regular rhythm and normal heart sounds.   No murmur heard. Pulmonary/Chest:  Effort normal and breath sounds normal. She exhibits no tenderness.  Abdominal: Soft. Bowel sounds are normal. There is no tenderness. There is no rebound.  Musculoskeletal: She exhibits no tenderness.       Baseline ROM, no obvious new focal weakness.  Neurological:       Mental status and motor strength appears baseline for patient and situation.  Skin: Skin is warm.       Right posterior scalp: approximally 1cm tender nodule with no overlying erythema, no fluctuance; mastoid process not tender to palpation  Psychiatric: She has a normal mood and affect. Her behavior is normal.  Note- TM and EACs normal bilaterally  ED Course  Procedures (including critical care time)  DIAGNOSTIC STUDIES: Oxygen Saturation is 100% on room air, normal by my interpretation.    COORDINATION OF CARE:  12:55AM -  EDMD will give abx and stronger pain meds   Labs Reviewed - No data to display No results found.   1. Lymphadenitis       MDM  Pt allergic to augmentin, clindamycin not recommended in lactation, per literature review- bactrim also crosses into breastmilk, but AAP considers it compatible with breastfeeding if infant is health and full term.  All the above was discussed with mom.  She will be given pain medication and a prescription for bactrim- she opts to contact her OB/PMD to decide whether to initate abx treatment.   I suspect symptoms are due to reactive lymph node versus lymphadenitis. There is no evidence of abscess on exam today. She's not tender over the mastoid process. Pain was much improved after Percocet in the ED period patient is nontoxic and well-hydrated in appearance and was discharged with strict return precautions. She is agreeable with the plan.  I personally performed the services described in this documentation, which was scribed in my presence. The recorded information has been reviewed and considered.        Laura Chick, MD 04/23/11 [id number]

## 2011-04-23 NOTE — ED Notes (Signed)
Pt has quarter size raised area to back of head.  Pt reports no injury or trauma to area.  Area is painful to touch

## 2011-04-23 NOTE — ED Notes (Signed)
Pt presents to ED today with "knot on the back of my neck" that pt states is very painful.

## 2011-05-19 ENCOUNTER — Encounter (HOSPITAL_COMMUNITY)
Admission: RE | Admit: 2011-05-19 | Discharge: 2011-05-19 | Disposition: A | Discharge status: Home / Self Care | Payer: BC Managed Care – PPO | Source: Ambulatory Visit | Attending: Obstetrics and Gynecology | Admitting: Obstetrics and Gynecology

## 2011-05-19 DIAGNOSIS — O923 Agalactia: Secondary | ICD-10-CM | POA: Insufficient documentation

## 2011-06-19 ENCOUNTER — Encounter (HOSPITAL_COMMUNITY)
Admission: RE | Admit: 2011-06-19 | Discharge: 2011-06-19 | Disposition: A | Discharge status: Home / Self Care | Payer: BC Managed Care – PPO | Source: Ambulatory Visit | Attending: Obstetrics and Gynecology | Admitting: Obstetrics and Gynecology

## 2011-06-19 DIAGNOSIS — O923 Agalactia: Secondary | ICD-10-CM | POA: Insufficient documentation

## 2011-07-20 ENCOUNTER — Encounter (HOSPITAL_COMMUNITY)
Admission: RE | Admit: 2011-07-20 | Discharge: 2011-07-20 | Disposition: A | Discharge status: Home / Self Care | Payer: BC Managed Care – PPO | Source: Ambulatory Visit | Attending: Obstetrics and Gynecology | Admitting: Obstetrics and Gynecology

## 2011-07-20 DIAGNOSIS — O923 Agalactia: Secondary | ICD-10-CM | POA: Insufficient documentation

## 2011-08-20 ENCOUNTER — Encounter (HOSPITAL_COMMUNITY)
Admission: RE | Admit: 2011-08-20 | Discharge: 2011-08-20 | Disposition: A | Discharge status: Home / Self Care | Payer: BC Managed Care – PPO | Source: Ambulatory Visit | Attending: Obstetrics and Gynecology | Admitting: Obstetrics and Gynecology

## 2011-08-20 DIAGNOSIS — O923 Agalactia: Secondary | ICD-10-CM | POA: Insufficient documentation

## 2011-08-30 ENCOUNTER — Encounter (HOSPITAL_BASED_OUTPATIENT_CLINIC_OR_DEPARTMENT_OTHER): Payer: Self-pay | Admitting: *Deleted

## 2011-08-30 ENCOUNTER — Emergency Department (HOSPITAL_BASED_OUTPATIENT_CLINIC_OR_DEPARTMENT_OTHER)
Admission: EM | Admit: 2011-08-30 | Discharge: 2011-08-31 | Disposition: A | Discharge status: Home / Self Care | Payer: BC Managed Care – PPO | Attending: Emergency Medicine | Admitting: Emergency Medicine

## 2011-08-30 ENCOUNTER — Emergency Department (HOSPITAL_BASED_OUTPATIENT_CLINIC_OR_DEPARTMENT_OTHER): Payer: BC Managed Care – PPO

## 2011-08-30 DIAGNOSIS — IMO0001 Reserved for inherently not codable concepts without codable children: Secondary | ICD-10-CM

## 2011-08-30 DIAGNOSIS — S6990XA Unspecified injury of unspecified wrist, hand and finger(s), initial encounter: Secondary | ICD-10-CM | POA: Insufficient documentation

## 2011-08-30 DIAGNOSIS — F32A Depression, unspecified: Secondary | ICD-10-CM | POA: Insufficient documentation

## 2011-08-30 DIAGNOSIS — G5600 Carpal tunnel syndrome, unspecified upper limb: Secondary | ICD-10-CM | POA: Insufficient documentation

## 2011-08-30 DIAGNOSIS — F53 Postpartum depression: Secondary | ICD-10-CM | POA: Insufficient documentation

## 2011-08-30 DIAGNOSIS — O99345 Other mental disorders complicating the puerperium: Secondary | ICD-10-CM | POA: Insufficient documentation

## 2011-08-30 DIAGNOSIS — W230XXA Caught, crushed, jammed, or pinched between moving objects, initial encounter: Secondary | ICD-10-CM | POA: Insufficient documentation

## 2011-08-30 DIAGNOSIS — K219 Gastro-esophageal reflux disease without esophagitis: Secondary | ICD-10-CM | POA: Insufficient documentation

## 2011-08-30 DIAGNOSIS — F329 Major depressive disorder, single episode, unspecified: Secondary | ICD-10-CM | POA: Insufficient documentation

## 2011-08-30 HISTORY — DX: Depression, unspecified: F32.A

## 2011-08-30 HISTORY — DX: Postpartum depression: F53.0

## 2011-08-30 HISTORY — DX: Major depressive disorder, single episode, unspecified: F32.9

## 2011-08-30 HISTORY — DX: Other mental disorders complicating the puerperium: O99.345

## 2011-08-30 MED ORDER — IBUPROFEN 800 MG PO TABS
ORAL_TABLET | ORAL | Status: AC
  Filled 2011-08-30: qty 1

## 2011-08-30 MED ORDER — IBUPROFEN 800 MG PO TABS
800.0000 mg | ORAL_TABLET | Freq: Once | ORAL | Status: AC
  Administered 2011-08-30: 800 mg via ORAL

## 2011-08-30 NOTE — ED Notes (Signed)
Pt slammed her right middle finger in a car door mild swelling and small abrasion to finger pt states that she cant move finger

## 2011-08-31 NOTE — Discharge Instructions (Signed)
RESOURCE GUIDE  Chronic Pain Problems: Contact [facility]  [phone] Patients need to be referred by their primary care doctor.  Insufficient Money for Medicine: Contact United Way:  call "211" or [facility] [phone].  No Primary Care Doctor: - Call [facility]  [phone] - can help you locate a primary care doctor that  accepts your insurance, provides certain services, etc. - Physician Referral Service- 1-800-533-3463  Agencies that provide inexpensive medical care: - [facility]  [phone] - [facility]  [phone] - [facility]  [phone] - [facility]  [phone] - [facility] Parenthood  [phone] - [facility]  [phone]  Medicaid-accepting [place] Providers: - [facility]- [address]  [phone], Mon-Fri 9am-7pm, Sat 9am-1pm - [facility]- [address], [address]  [phone] - [facility]- [address], [address]  [phone] - [facility]- [address] [place] [address]  [phone] - [facility]- [address], [address], [phone]  Only accepts [place] Access Medicaid patients after they have their name  applied to their card  Self Pay (no insurance) in [place]: - Sickle Cell Patients: Dr Eric Dean, [facility]  [address], [phone] - [facility]- [address]  [phone]       -     [facility]- [address]       -     [facility]- see information above (Speak to Pam H if you do not have insurance)       -  [facility]- [address], [phone]       -  [facility] [place]- [address],  [phone]       -  [facility]- [address] [place] [address], [phone]       -  Dr Osei-Bonsu-  [address], [place], [phone]       -  [facility]- [address], [phone]       -  [facility]- [address] [place] [address], [phone], also [address], [phone]       -    [facility]- [address], [phone], 1st & 3rd Saturday   every month, 10am-1pm  1) Find a Doctor and Pay Out of Pocket Although you won't have to find out who is covered by your insurance plan, it is a good idea to ask around and get recommendations. You will then need to call the office and see if the doctor you have chosen will accept you as a new patient and what types of options they offer for patients who are self-pay. Some doctors offer discounts or will set up payment plans for their patients who do not have insurance, but you will need to ask so you aren't surprised when you get to your appointment.  2) Contact Your Local Health Department Not all health departments have doctors that can see patients for sick visits, but many do, so it is worth a call to see if yours does. If you don't know where your local health department is, you can check in your phone book. The CDC also has a tool to help you locate your state's health department, and many state websites also have   listings of all of their local health departments.  3) Find a Walk-in Clinic If your illness is not likely to be very severe or complicated, you may want to try a walk in clinic. These are popping up all over the country in pharmacies, drugstores, and shopping centers. They're usually staffed by nurse practitioners or physician assistants that have been trained to treat common illnesses and complaints. They're usually fairly quick and inexpensive. However, if you have serious medical issues or chronic medical problems, these are probably not your best option  STD Testing - [place] [facility] , [facility], [address], [place], phone [phone] or 1-877-539-9860.  Monday - Friday, call for an appointment. - [place]  [facility] [place], [facility], [address], [place], phone [phone] or 1-877-539-9860.  Monday - Friday, call for an appointment.  Abuse/Neglect: - [place] Child Abuse Hotline (336) 641-3795 - [place] Child Abuse Hotline 800-378-5315 (After Hours)  Emergency Shelter:  [facility] (336) 271-5985  Maternity Homes: - [facility] (336) 275-9566 - [facility] (704) 372-4663  MRSA Hotline #:   832-7006  [place] Resources  [facility] [place]  [employer] [place] [facility] [address]                 [address]         [address]  [place]                                               [place]                              [place] Phone:  [phone]                                  Phone:  [phone]                   Phone:  [phone]  [place] [facility], [phone] - [place] Services - [facility]- 1-888-581-9988       -     [facility] in , [address],                                  336-349-4454, Insurance  [place] Child Abuse Hotline (336) 342-1394 or (336) 342-3537 (After Hours)   Behavioral Health Services  Substance Abuse Resources: - [facility]  336-882-2125 - [facility] 336-784-9470 - [facility] 336-285-9073 - [facility] 336-845-3988 - Residential & Outpatient Substance Abuse Program  800-659-3381  Psychological Services: - [facility]  [phone] - [facility]  [phone] - [place], [address], [place], ACCESS LINE: 1-800-853-5163 or 336-641-4981, Http://www.[facility].com/services/adult.htm  Dental Assistance  If unable to pay or uninsured, contact:  [facility] or [place] [facility] to become qualified for the adult dental  clinic.  Patients with Medicaid: [facility] [address], [phone], [phone]  If unable   to pay, or uninsured, contact [facility] ([phone]) or [place] [facility] ([phone] in [place], [phone] in [place]) to become qualified for the adult dental clinic  Other Low-Cost Community Dental Services: - [facility]- [address], [place], [place], [address], [phone], Ext. 123, 2nd and 4th Thursday of the month at 6:30am.  10 clients each day by appointment, can sometimes see walk-in patients if someone does not show for an appointment. - [facility]- [address], [place], [place], [address], [phone] - [facility]- [address], [place], [place], [address], [phone] - [place] [facility]- [phone] - [facility]- [phone] - [facility]- [phone]    

## 2011-08-31 NOTE — ED Provider Notes (Signed)
History     CSN: [id number]  Arrival date & time 08/30/11  2315   First MD Initiated Contact with Patient 08/30/11 2328      Chief Complaint  Patient presents with  . Finger Injury    (Consider location/radiation/quality/duration/timing/severity/associated sxs/prior treatment) HPI Patient is a 31 year old female who presents with complaint of 8/10 right third finger pain after closing the affected finger in a car door about one hour prior to arrival. Patient has taken no medications but has used ice. Patient has pain at the right third PIP extending proximally up to the PIP. She has a small nonbleeding laceration over the right third middle phalanx. There is no deformity noted. Patient has tenderness to palpation and slight edema.  Pain is worse with movement and palpation. Ice has not alleviated her pain. There are no other associated injuries. There are no other associated or modifying factors. Pain is described as a throbbing ache. Past Medical History  Diagnosis Date  . 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 Kidney stones   . Asthma   . Carpal tunnel syndrome of right wrist   . Headache   . GERD (gastroesophageal reflux disease)   . Post partum depression   . Depression     Past Surgical History  Procedure Date  . Cesarean section   . Lithotripsy     stint and stone removal ( ureter kinked)  . Cesarean section 01/09/2011    Procedure: CESAREAN SECTION;  Surgeon: Esmeralda Arthur, MD;  Location: [facility];  Service: Gynecology;  Laterality: N/A;    Family History  Problem Relation Age of Onset  . Cancer Mother   . Depression Mother   . Hypertension Mother   . Hypertension Father   . Depression Maternal Grandmother   . Hypertension Maternal Grandmother     History  Substance Use Topics  . Smoking status: Never  Smoker   . Smokeless tobacco: Never Used  . Alcohol Use: No    OB History    Grav Para Term Preterm Abortions TAB SAB Ect Mult Living   3 3 1 2      2       Review of Systems  Constitutional: Negative.   HENT: Negative.   Eyes: Negative.   Respiratory: Negative.   Cardiovascular: Negative.   Gastrointestinal: Negative.   Genitourinary: Negative.   Musculoskeletal:       Right third finger pain as mentioned in history of present illness  Neurological: Negative.   Hematological: Negative.   All other systems reviewed and are negative.    Allergies  Abilify; Augmentin; and Imitrex  Home Medications   Current Outpatient Rx  Name Route Sig Dispense Refill  . FLUOXETINE HCL 10 MG PO CAPS Oral Take 10 mg by mouth daily.    . BUPROPION HCL ER (XL) 300 MG PO TB24 Oral Take 300 mg by mouth daily.    Marland Kitchen CETIRIZINE HCL 10 MG PO TABS Oral Take 10 mg by mouth daily.     . GUAIFENESIN ER 600 MG PO TB12 Oral Take 600-1,200 mg by mouth 2 (two) times daily as needed. For cough/cold     . PANTOPRAZOLE SODIUM 40 MG PO TBEC Oral Take 40 mg by mouth daily.     Marland Kitchen FLINTSTONES PLUS IRON PO Oral Take 1 tablet by mouth daily.  BP 130/91  Pulse 77  Temp 98.2 F (36.8 C) (Oral)  Resp 16  SpO2 100%  Physical Exam  Nursing note and vitals reviewed. GEN: Well-developed, well-nourished female in no distress HEENT: Atraumatic, normocephalic.  EYES: PERRLA BL, no scleral icterus. NECK: Trachea midline, no meningismus CV: regular rate and rhythm.  PULM: No respiratory distress.   Neuro: cranial nerves grossly 2-12 intact, no abnormalities of strength or sensation, A and O x 3 MSK: Patient has right third finger with tenderness to palpation from the PIP extending distally to the PIP. There is mild edema noted in this area. No deformity is noted. Patient has limitation of flexion at this area secondary to pain. Patient has one small laceration over the dorsum of the middle phalanx that is not  actively bleeding. This in no way appears to be an open fracture. Otherwise exam is within normal limits. Skin: No rashes petechiae, purpura, or jaundice Psych: no abnormality of mood   ED Course  Procedures (including critical care time)  Labs Reviewed - No data to display Dg Finger Middle Right  08/31/2011  *RADIOLOGY REPORT*  Clinical Data: Trauma and pain.  RIGHT MIDDLE FINGER 2+V  Comparison: None.  Findings: There is probable soft tissue swelling about the proximal interphalangeal joint, mild. No acute fracture or dislocation.  IMPRESSION: Mild soft tissue swelling, without acute osseous abnormality.  Original Report Authenticated By: Consuello Bossier, M.D.     1. Injury of third finger of right hand       MDM  Patient was evaluated by myself. Based on evaluation I have low suspicion for bony injury. Patient was given an ice pack as well as ibuprofen. Plain film was performed given patient's limitation of motion. This was negative. Patient was notified of results. Patient was able to demonstrate flexion on my repeat exam. Patient was instructed to use over-the-counter medications as well as ice and elevation for her pain. She was discharged in good condition and should not require followup. Of note films were reviewed extemporaneously myself during this encounter.        Cyndra Numbers, MD 08/31/11 [phone]

## 2011-09-19 ENCOUNTER — Encounter (HOSPITAL_COMMUNITY)
Admission: RE | Admit: 2011-09-19 | Discharge: 2011-09-19 | Disposition: A | Discharge status: Home / Self Care | Payer: BC Managed Care – PPO | Source: Ambulatory Visit | Attending: Obstetrics and Gynecology | Admitting: Obstetrics and Gynecology

## 2011-09-19 DIAGNOSIS — O923 Agalactia: Secondary | ICD-10-CM | POA: Insufficient documentation

## 2011-10-21 ENCOUNTER — Encounter (HOSPITAL_COMMUNITY)
Admission: RE | Admit: 2011-10-21 | Discharge: 2011-10-21 | Disposition: A | Discharge status: Home / Self Care | Payer: BC Managed Care – PPO | Source: Ambulatory Visit | Attending: Obstetrics and Gynecology | Admitting: Obstetrics and Gynecology

## 2011-10-21 DIAGNOSIS — O923 Agalactia: Secondary | ICD-10-CM | POA: Insufficient documentation

## 2012-03-14 ENCOUNTER — Encounter (HOSPITAL_BASED_OUTPATIENT_CLINIC_OR_DEPARTMENT_OTHER): Payer: Self-pay | Admitting: *Deleted

## 2012-03-14 ENCOUNTER — Emergency Department (HOSPITAL_BASED_OUTPATIENT_CLINIC_OR_DEPARTMENT_OTHER)
Admission: AD | Admit: 2012-03-14 | Discharge: 2012-03-14 | Disposition: A | Discharge status: Home / Self Care | Payer: BC Managed Care – PPO | Attending: Emergency Medicine | Admitting: Emergency Medicine

## 2012-03-14 ENCOUNTER — Emergency Department (HOSPITAL_BASED_OUTPATIENT_CLINIC_OR_DEPARTMENT_OTHER): Payer: BC Managed Care – PPO

## 2012-03-14 DIAGNOSIS — R059 Cough, unspecified: Secondary | ICD-10-CM | POA: Insufficient documentation

## 2012-03-14 DIAGNOSIS — F329 Major depressive disorder, single episode, unspecified: Secondary | ICD-10-CM | POA: Insufficient documentation

## 2012-03-14 DIAGNOSIS — F3289 Other specified depressive episodes: Secondary | ICD-10-CM | POA: Insufficient documentation

## 2012-03-14 DIAGNOSIS — R05 Cough: Secondary | ICD-10-CM | POA: Insufficient documentation

## 2012-03-14 DIAGNOSIS — J45909 Unspecified asthma, uncomplicated: Secondary | ICD-10-CM | POA: Insufficient documentation

## 2012-03-14 DIAGNOSIS — J069 Acute upper respiratory infection, unspecified: Secondary | ICD-10-CM

## 2012-03-14 DIAGNOSIS — R110 Nausea: Secondary | ICD-10-CM | POA: Insufficient documentation

## 2012-03-14 DIAGNOSIS — J3489 Other specified disorders of nose and nasal sinuses: Secondary | ICD-10-CM | POA: Insufficient documentation

## 2012-03-14 DIAGNOSIS — Z87442 Personal history of urinary calculi: Secondary | ICD-10-CM | POA: Insufficient documentation

## 2012-03-14 DIAGNOSIS — R5383 Other fatigue: Secondary | ICD-10-CM | POA: Insufficient documentation

## 2012-03-14 DIAGNOSIS — R5381 Other malaise: Secondary | ICD-10-CM | POA: Insufficient documentation

## 2012-03-14 DIAGNOSIS — K219 Gastro-esophageal reflux disease without esophagitis: Secondary | ICD-10-CM | POA: Insufficient documentation

## 2012-03-14 DIAGNOSIS — Z8742 Personal history of other diseases of the female genital tract: Secondary | ICD-10-CM | POA: Insufficient documentation

## 2012-03-14 DIAGNOSIS — Z8739 Personal history of other diseases of the musculoskeletal system and connective tissue: Secondary | ICD-10-CM | POA: Insufficient documentation

## 2012-03-14 DIAGNOSIS — H938X9 Other specified disorders of ear, unspecified ear: Secondary | ICD-10-CM | POA: Insufficient documentation

## 2012-03-14 DIAGNOSIS — J029 Acute pharyngitis, unspecified: Secondary | ICD-10-CM | POA: Insufficient documentation

## 2012-03-14 DIAGNOSIS — Z79899 Other long term (current) drug therapy: Secondary | ICD-10-CM | POA: Insufficient documentation

## 2012-03-14 DIAGNOSIS — Z8679 Personal history of other diseases of the circulatory system: Secondary | ICD-10-CM | POA: Insufficient documentation

## 2012-03-14 DIAGNOSIS — G5600 Carpal tunnel syndrome, unspecified upper limb: Secondary | ICD-10-CM | POA: Insufficient documentation

## 2012-03-14 HISTORY — DX: Unspecified osteoarthritis, unspecified site: M19.90

## 2012-03-14 MED ORDER — GUAIFENESIN-CODEINE 100-10 MG/5ML PO SYRP: 5.0000 mL | ORAL_SOLUTION | Freq: Three times a day (TID) | ORAL | Status: DC | PRN

## 2012-03-14 MED ORDER — ALBUTEROL SULFATE HFA 108 (90 BASE) MCG/ACT IN AERS: 2.0000 | INHALATION_SPRAY | RESPIRATORY_TRACT | Status: DC | PRN

## 2012-03-14 NOTE — ED Provider Notes (Signed)
History     CSN: [id number]  Arrival date & time 03/14/12  6295   First MD Initiated Contact with Patient 03/14/12 2034      Chief Complaint  Patient presents with  . Shortness of Breath    (Consider location/radiation/quality/duration/timing/severity/associated sxs/prior treatment) Patient is a 31 y.o. female presenting with shortness of breath and URI. The history is provided by the patient and the spouse. No language interpreter was used.  Shortness of Breath  Associated symptoms include rhinorrhea, cough and shortness of breath. Pertinent negatives include no fever, no sore throat and no wheezing.  URI The primary symptoms include fatigue, cough and nausea. Primary symptoms do not include fever, headaches, ear pain, sore throat, wheezing or vomiting. The current episode started 2 days ago. This is a new problem. The problem has been gradually worsening.  Symptoms associated with the illness include plugged ear sensation, sinus pressure, congestion and rhinorrhea. The illness is not associated with chills.   31 year old female coming in with cough and shortness of breath. States that the cough started 2 days ago when the shortness of breath was started today. Denies nausea vomiting diarrhea. States adverse throat is also sore. She has taken Alka-Seltzer cold and flu with some relief. She is afebrile she appears nontoxic. Her voice is hoarse. Her primary care provider is Dr. Norva Pavlov if the care of her family practice. Past medical history of asthma, headache, GERD, depression, C-section.  Past Medical History  Diagnosis Date  . 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 Kidney stones   . Asthma   . Carpal tunnel syndrome of right wrist   . Headache   . GERD (gastroesophageal reflux disease)   . Post partum depression   .  Depression   . Arthritis     Past Surgical History  Procedure Date  . Cesarean section   . Lithotripsy     stint and stone removal ( ureter kinked)  . Cesarean section 01/09/2011    Procedure: CESAREAN SECTION;  Surgeon: Esmeralda Arthur, MD;  Location: [facility];  Service: Gynecology;  Laterality: N/A;    Family History  Problem Relation Age of Onset  . Cancer Mother   . Depression Mother   . Hypertension Mother   . Hypertension Father   . Depression Maternal Grandmother   . Hypertension Maternal Grandmother     History  Substance Use Topics  . Smoking status: Never Smoker   . Smokeless tobacco: Never Used  . Alcohol Use: No    OB History    Grav Para Term Preterm Abortions TAB SAB Ect Mult Living   3 3 1 2      2       Review of Systems  Constitutional: Positive for fatigue. Negative for fever and chills.  HENT: Positive for congestion, rhinorrhea and sinus pressure. Negative for ear pain and sore throat.   Respiratory: Positive for cough and shortness of breath. Negative for wheezing.   Gastrointestinal: Positive for nausea. Negative for vomiting.  Neurological: Negative for headaches.    Allergies  Abilify; Augmentin; and Imitrex  Home Medications   Current Outpatient Rx  Name  Route  Sig  Dispense  Refill  . DULOXETINE HCL 20 MG PO CPEP   Oral   Take 20 mg by mouth daily.         Marland Kitchen GABAPENTIN 100  MG PO CAPS   Oral   Take 100 mg by mouth 3 (three) times daily.         . BUPROPION HCL ER (XL) 300 MG PO TB24   Oral   Take 300 mg by mouth daily.         Marland Kitchen CETIRIZINE HCL 10 MG PO TABS   Oral   Take 10 mg by mouth daily.          Marland Kitchen FLUOXETINE HCL 10 MG PO CAPS   Oral   Take 10 mg by mouth daily.         . GUAIFENESIN ER 600 MG PO TB12   Oral   Take 600-1,200 mg by mouth 2 (two) times daily as needed. For cough/cold          . PANTOPRAZOLE SODIUM 40 MG PO TBEC   Oral   Take 40 mg by mouth daily.          Marland Kitchen FLINTSTONES PLUS IRON  PO   Oral   Take 1 tablet by mouth daily.             BP 135/82  Pulse 98  Temp 98.4 F (36.9 C) (Oral)  Resp 18  SpO2 100%  Physical Exam  Nursing note and vitals reviewed. Constitutional: She is oriented to person, place, and time. She appears well-developed and well-nourished.  HENT:  Head: Normocephalic and atraumatic.  Eyes: Conjunctivae normal and EOM are normal. Pupils are equal, round, and reactive to light.  Neck: Normal range of motion. Neck supple.  Cardiovascular: Normal rate.   Pulmonary/Chest: Effort normal and breath sounds normal. No respiratory distress. She has no wheezes.  Abdominal: Soft. Bowel sounds are normal. She exhibits no distension. There is no tenderness.  Musculoskeletal: Normal range of motion. She exhibits no edema and no tenderness.  Neurological: She is alert and oriented to person, place, and time. She has normal reflexes.  Skin: Skin is warm and dry.  Psychiatric: She has a normal mood and affect.    ED Course  Procedures (including critical care time)  Labs Reviewed - No data to display Dg Chest 2 View  03/14/2012  *RADIOLOGY REPORT*  Clinical Data: Shortness of breath and congestion  CHEST - 2 VIEW  Comparison: None.  Findings: The heart size and mediastinal contours are within normal limits.  Both lungs are clear.  The visualized skeletal structures are unremarkable.  IMPRESSION: Negative exam.   Original Report Authenticated By: Signa Kell, M.D.      No diagnosis found.    MDM  Upper respiratory virus.  Chest x-ray negative for acute process reviewed by myself.  Rx for albuterol inhaler and robitussin ac. She will follow up with her pcp.         Remi Haggard, NP 03/15/12 1028

## 2012-03-14 NOTE — ED Notes (Signed)
Pt amb to triage with quick steady gait in nad. Pt reports cough, congestion and sob x 12/25

## 2012-03-22 NOTE — ED Provider Notes (Signed)
Medical screening examination/treatment/procedure(s) were performed by non-physician practitioner and as supervising physician I was immediately available for consultation/collaboration.   Tamecca Artiga, MD 03/22/12 1008 

## 2012-04-29 ENCOUNTER — Other Ambulatory Visit: Payer: Self-pay | Admitting: Obstetrics and Gynecology

## 2012-05-09 ENCOUNTER — Other Ambulatory Visit: Payer: Self-pay | Admitting: Obstetrics and Gynecology

## 2012-06-04 ENCOUNTER — Emergency Department (HOSPITAL_COMMUNITY)
Admission: EM | Admit: 2012-06-04 | Discharge: 2012-06-04 | Disposition: A | Discharge status: Home / Self Care | Payer: BC Managed Care – PPO | Attending: Emergency Medicine | Admitting: Emergency Medicine

## 2012-06-04 ENCOUNTER — Encounter (HOSPITAL_COMMUNITY): Payer: Self-pay

## 2012-06-04 DIAGNOSIS — T4275XA Adverse effect of unspecified antiepileptic and sedative-hypnotic drugs, initial encounter: Secondary | ICD-10-CM | POA: Insufficient documentation

## 2012-06-04 DIAGNOSIS — F3289 Other specified depressive episodes: Secondary | ICD-10-CM | POA: Insufficient documentation

## 2012-06-04 DIAGNOSIS — T50905A Adverse effect of unspecified drugs, medicaments and biological substances, initial encounter: Secondary | ICD-10-CM

## 2012-06-04 DIAGNOSIS — Z87442 Personal history of urinary calculi: Secondary | ICD-10-CM | POA: Insufficient documentation

## 2012-06-04 DIAGNOSIS — Z8669 Personal history of other diseases of the nervous system and sense organs: Secondary | ICD-10-CM | POA: Insufficient documentation

## 2012-06-04 DIAGNOSIS — R5383 Other fatigue: Secondary | ICD-10-CM | POA: Insufficient documentation

## 2012-06-04 DIAGNOSIS — M797 Fibromyalgia: Secondary | ICD-10-CM

## 2012-06-04 DIAGNOSIS — J45909 Unspecified asthma, uncomplicated: Secondary | ICD-10-CM | POA: Insufficient documentation

## 2012-06-04 DIAGNOSIS — Z79899 Other long term (current) drug therapy: Secondary | ICD-10-CM | POA: Insufficient documentation

## 2012-06-04 DIAGNOSIS — IMO0001 Reserved for inherently not codable concepts without codable children: Secondary | ICD-10-CM | POA: Insufficient documentation

## 2012-06-04 DIAGNOSIS — F329 Major depressive disorder, single episode, unspecified: Secondary | ICD-10-CM | POA: Insufficient documentation

## 2012-06-04 DIAGNOSIS — Z8739 Personal history of other diseases of the musculoskeletal system and connective tissue: Secondary | ICD-10-CM | POA: Insufficient documentation

## 2012-06-04 DIAGNOSIS — K219 Gastro-esophageal reflux disease without esophagitis: Secondary | ICD-10-CM | POA: Insufficient documentation

## 2012-06-04 DIAGNOSIS — IMO0002 Reserved for concepts with insufficient information to code with codable children: Secondary | ICD-10-CM | POA: Insufficient documentation

## 2012-06-04 DIAGNOSIS — R5381 Other malaise: Secondary | ICD-10-CM | POA: Insufficient documentation

## 2012-06-04 LAB — CBC WITH DIFFERENTIAL/PLATELET
Basophils Absolute: 0 10*3/uL (ref 0.0–0.1)
Basophils Relative: 0 % (ref 0–1)
Eosinophils Absolute: 0.2 10*3/uL (ref 0.0–0.7)
MCH: 27.3 pg (ref 26.0–34.0)
MCHC: 34 g/dL (ref 30.0–36.0)
Neutrophils Relative %: 66 % (ref 43–77)
Platelets: 282 10*3/uL (ref 150–400)

## 2012-06-04 LAB — BASIC METABOLIC PANEL
GFR calc Af Amer: >90 mL/min (ref >90)
GFR calc non Af Amer: >90 mL/min (ref >90)
Potassium: 4 mEq/L (ref 3.5–5.1)
Sodium: 136 mEq/L (ref 135–145)

## 2012-06-04 MED ORDER — FAMOTIDINE IN NACL 20-0.9 MG/50ML-% IV SOLN
20.0000 mg | Freq: Once | INTRAVENOUS | Status: AC
  Administered 2012-06-04: 20 mg via INTRAVENOUS
  Filled 2012-06-04: qty 50

## 2012-06-04 MED ORDER — FAMOTIDINE 20 MG PO TABS: 20.0000 mg | ORAL_TABLET | Freq: Two times a day (BID) | ORAL | Status: DC

## 2012-06-04 MED ORDER — SODIUM CHLORIDE 0.9 % IV BOLUS (SEPSIS)
500.0000 mL | Freq: Once | INTRAVENOUS | Status: AC
  Administered 2012-06-04: 500 mL via INTRAVENOUS

## 2012-06-04 MED ORDER — DIPHENHYDRAMINE HCL 25 MG PO TABS: 25.0000 mg | ORAL_TABLET | Freq: Four times a day (QID) | ORAL | Status: DC

## 2012-06-04 MED ORDER — HYDROCODONE-ACETAMINOPHEN 5-325 MG PO TABS: 1.0000 | ORAL_TABLET | Freq: Four times a day (QID) | ORAL | Status: DC | PRN

## 2012-06-04 MED ORDER — HYDROMORPHONE HCL PF 1 MG/ML IJ SOLN
1.0000 mg | Freq: Once | INTRAMUSCULAR | Status: AC
  Administered 2012-06-04: 1 mg via INTRAVENOUS
  Filled 2012-06-04: qty 1

## 2012-06-04 MED ORDER — ONDANSETRON HCL 4 MG/2ML IJ SOLN
4.0000 mg | Freq: Once | INTRAMUSCULAR | Status: AC
  Administered 2012-06-04: 4 mg via INTRAVENOUS
  Filled 2012-06-04: qty 2

## 2012-06-04 MED ORDER — SODIUM CHLORIDE 0.9 % IV SOLN
INTRAVENOUS | Status: DC
  Administered 2012-06-04: 13:00:00 via INTRAVENOUS

## 2012-06-04 MED ORDER — DIPHENHYDRAMINE HCL 50 MG/ML IJ SOLN
25.0000 mg | Freq: Once | INTRAMUSCULAR | Status: AC
  Administered 2012-06-04: 25 mg via INTRAVENOUS
  Filled 2012-06-04: qty 1

## 2012-06-04 NOTE — ED Provider Notes (Addendum)
History     CSN: [id number]  Arrival date & time 06/04/12  1101   First MD Initiated Contact with Patient 06/04/12 1206      Chief Complaint  Patient presents with  . Allergic Reaction    (Consider location/radiation/quality/duration/timing/severity/associated sxs/prior treatment) Patient is a 32 y.o. female presenting with allergic reaction. The history is provided by the patient.  Allergic Reaction The primary symptoms do not include shortness of breath, abdominal pain or rash.  Significant symptoms that are not present include eye redness.   patient is followed by of [facility] in [facility] area. She's been on gabapentin for a period of time for fibromyalgia with attempts to try to control that. Patient's dose was increased 2 weeks ago patient experienced difficulty with side effects with gabapentin in the past and they had to slowly increase the amount of medicine to try to call control her fibromyalgia. Patient now is having pain all over same the gabapentin is not helping. She's been itchy but without rash. Also she has developed blisters on her tongue in the bottom of her lips and feels that her tongue is swollen. She contacted her memory care office and they told her to come to the ED for possible allergic reaction. Patient has no trouble swallowing. No changes in her voice.  Past Medical History  Diagnosis Date  . 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 Kidney stones   . Asthma   . Carpal tunnel syndrome of right wrist   . Headache   . GERD (gastroesophageal reflux disease)   . Post partum depression   . Depression   . Arthritis     Past Surgical History  Procedure Laterality Date  . Cesarean section    . Lithotripsy      stint and stone removal ( ureter kinked)  . Cesarean section  01/09/2011     Procedure: CESAREAN SECTION;  Surgeon: Alwyn Pea, MD;  Location: [facility];  Service: Gynecology;  Laterality: N/A;    Family History  Problem Relation Age of Onset  . Cancer Mother   . Depression Mother   . Hypertension Mother   . Hypertension Father   . Depression Maternal Grandmother   . Hypertension Maternal Grandmother     History  Substance Use Topics  . Smoking status: Never Smoker   . Smokeless tobacco: Never Used  . Alcohol Use: No    OB History   Grav Para Term Preterm Abortions TAB SAB Ect Mult Living   3 3 1 2      2       Review of Systems  Constitutional: Positive for fatigue. Negative for fever.  HENT: Positive for mouth sores. Negative for congestion, trouble swallowing and voice change.   Eyes: Negative for redness.  Respiratory: Negative for shortness of breath.   Cardiovascular: Negative for chest pain.  Gastrointestinal: Negative for abdominal pain.  Genitourinary: Negative for dysuria.  Musculoskeletal: Positive for myalgias.  Skin: Negative for rash.  Neurological: Negative for headaches.  Hematological: Does not bruise/bleed easily.  Psychiatric/Behavioral: Negative for confusion.    Allergies  Gabapentin; Abilify; Augmentin; and Imitrex  Home Medications   Current Outpatient Rx  Name  Route  Sig  Dispense  Refill  . albuterol (PROVENTIL HFA;VENTOLIN HFA) 108 (90 BASE) MCG/ACT inhaler   Inhalation   Inhale 2 puffs into  the lungs every 4 (four) hours as needed for wheezing.         . DULoxetine (CYMBALTA) 60 MG capsule   Oral   Take 60 mg by mouth daily.         Marland Kitchen gabapentin (NEURONTIN) 100 MG capsule   Oral   Take 100 mg by mouth 3 (three) times daily.         Marland Kitchen gabapentin (NEURONTIN) 300 MG capsule   Oral   Take 300 mg by mouth at bedtime.         . diphenhydrAMINE (BENADRYL) 12.5 MG/5ML elixir   Oral   Take 50 mg by mouth once.         . diphenhydrAMINE (BENADRYL) 25 MG tablet   Oral   Take 1 tablet (25 mg  total) by mouth every 6 (six) hours.   20 tablet   0   . famotidine (PEPCID) 20 MG tablet   Oral   Take 1 tablet (20 mg total) by mouth 2 (two) times daily.   14 tablet   0   . HYDROcodone-acetaminophen (NORCO/VICODIN) 5-325 MG per tablet   Oral   Take 1-2 tablets by mouth every 6 (six) hours as needed for pain.   20 tablet   0     BP 124/86  Pulse 72  Temp(Src) 98.5 F (36.9 C) (Oral)  Resp 16  SpO2 100%  Physical Exam  Nursing note and vitals reviewed. Constitutional: She is oriented to person, place, and time. She appears well-developed and well-nourished. No distress.  HENT:  Head: Normocephalic and atraumatic.  Mouth/Throat: Oropharynx is clear and moist. No oropharyngeal exudate.  Patient with a herpetic like skin lesion on the lower lip ulcer-type few ulcers on the oral mucosa. No significant tongue swelling no erythema to the back of throat uvula is midline no tonsillar swelling. No evidence of any difficulty swallowing.  Eyes: Conjunctivae and EOM are normal. Pupils are equal, round, and reactive to light.  Neck: Normal range of motion. No tracheal deviation present.  Cardiovascular: Normal rate, regular rhythm, normal heart sounds and intact distal pulses.   No murmur heard. Pulmonary/Chest: Effort normal and breath sounds normal. No respiratory distress. She has no wheezes.  Abdominal: Soft. Bowel sounds are normal. There is no tenderness.  Musculoskeletal: Normal range of motion. She exhibits no edema and no tenderness.  Lymphadenopathy:    She has no cervical adenopathy.  Neurological: She is alert and oriented to person, place, and time. No cranial nerve deficit. She exhibits normal muscle tone. Coordination normal.  Skin: Skin is warm. No rash noted. No erythema.    ED Course  Procedures (including critical care time)  Labs Reviewed  CBC WITH DIFFERENTIAL  BASIC METABOLIC PANEL   No results found.   1. Fibromyalgia   2. Adverse drug reaction,  initial encounter       MDM   Suspect patient's symptoms are adverse drug reaction to gabapentin. Many of her pain complaints are related to fibromyalgia. I recommend stopping the gabapentin. No true allergic reaction. The oral lesions could be a herpetic outbreak or could be a viral stomatitis. Did not think this is consistent with Remo Lipps Johnson's or erythema multiforme type reaction at this point in time. We'll continue Benadryl low and Pepcid because it seemed to helped. Patient will stop the gabapentin 12 her primary care doctors in the next few days patient has been given hydrocodone to take care of the fibromyalgia pain in the meantime. Patient  improved in the emergency department in no acute distress nontoxic. No significant tongue swelling. No lip swelling. No wheezing. No hives. Patient did have some skin itching which resolved with the Benadryl and Pepcid.   Clinically suspect symptoms are more related to side effects from the gabapentin and not a true allergic reaction.     Mervin Kung, MD 06/04/12 1640  Mervin Kung, MD 06/04/12 1640

## 2012-06-04 NOTE — ED Notes (Signed)
Pt. Having pain all over. Pt. Is on  Gabapentin and they increased her dose since last Monday.  Yesterday she developed blisters on her tongue and bottom lips.    Airway patent. No resp. Distress No hives noted to her skin

## 2013-04-27 ENCOUNTER — Encounter (HOSPITAL_BASED_OUTPATIENT_CLINIC_OR_DEPARTMENT_OTHER): Payer: Self-pay | Admitting: Emergency Medicine

## 2013-04-27 ENCOUNTER — Emergency Department (HOSPITAL_BASED_OUTPATIENT_CLINIC_OR_DEPARTMENT_OTHER)
Admission: EM | Admit: 2013-04-27 | Discharge: 2013-04-27 | Disposition: A | Discharge status: Home / Self Care | Payer: BC Managed Care – PPO | Attending: Emergency Medicine | Admitting: Emergency Medicine

## 2013-04-27 DIAGNOSIS — M542 Cervicalgia: Secondary | ICD-10-CM | POA: Insufficient documentation

## 2013-04-27 DIAGNOSIS — K219 Gastro-esophageal reflux disease without esophagitis: Secondary | ICD-10-CM | POA: Insufficient documentation

## 2013-04-27 DIAGNOSIS — Z79899 Other long term (current) drug therapy: Secondary | ICD-10-CM | POA: Insufficient documentation

## 2013-04-27 DIAGNOSIS — Z87442 Personal history of urinary calculi: Secondary | ICD-10-CM | POA: Insufficient documentation

## 2013-04-27 DIAGNOSIS — F3289 Other specified depressive episodes: Secondary | ICD-10-CM | POA: Insufficient documentation

## 2013-04-27 DIAGNOSIS — F329 Major depressive disorder, single episode, unspecified: Secondary | ICD-10-CM | POA: Insufficient documentation

## 2013-04-27 DIAGNOSIS — R5383 Other fatigue: Secondary | ICD-10-CM

## 2013-04-27 DIAGNOSIS — Z8669 Personal history of other diseases of the nervous system and sense organs: Secondary | ICD-10-CM | POA: Insufficient documentation

## 2013-04-27 DIAGNOSIS — M129 Arthropathy, unspecified: Secondary | ICD-10-CM | POA: Insufficient documentation

## 2013-04-27 DIAGNOSIS — M2550 Pain in unspecified joint: Secondary | ICD-10-CM | POA: Insufficient documentation

## 2013-04-27 DIAGNOSIS — G43909 Migraine, unspecified, not intractable, without status migrainosus: Secondary | ICD-10-CM | POA: Insufficient documentation

## 2013-04-27 DIAGNOSIS — J45909 Unspecified asthma, uncomplicated: Secondary | ICD-10-CM | POA: Insufficient documentation

## 2013-04-27 DIAGNOSIS — R5381 Other malaise: Secondary | ICD-10-CM | POA: Insufficient documentation

## 2013-04-27 DIAGNOSIS — IMO0001 Reserved for inherently not codable concepts without codable children: Secondary | ICD-10-CM | POA: Insufficient documentation

## 2013-04-27 MED ORDER — METOCLOPRAMIDE HCL 5 MG/ML IJ SOLN
10.0000 mg | Freq: Once | INTRAMUSCULAR | Status: AC
  Administered 2013-04-27: 10 mg via INTRAVENOUS
  Filled 2013-04-27: qty 2

## 2013-04-27 MED ORDER — DIPHENHYDRAMINE HCL 50 MG/ML IJ SOLN
25.0000 mg | Freq: Once | INTRAMUSCULAR | Status: AC
  Administered 2013-04-27: 25 mg via INTRAVENOUS
  Filled 2013-04-27: qty 1

## 2013-04-27 MED ORDER — SODIUM CHLORIDE 0.9 % IV BOLUS (SEPSIS)
1000.0000 mL | Freq: Once | INTRAVENOUS | Status: AC
  Administered 2013-04-27: 1000 mL via INTRAVENOUS

## 2013-04-27 MED ORDER — DEXAMETHASONE SODIUM PHOSPHATE 10 MG/ML IJ SOLN
10.0000 mg | Freq: Once | INTRAMUSCULAR | Status: AC
  Administered 2013-04-27: 10 mg via INTRAVENOUS
  Filled 2013-04-27: qty 1

## 2013-04-27 NOTE — ED Provider Notes (Signed)
CSN: [id number]     Arrival date & time 04/27/13  1208 History   First MD Initiated Contact with Patient 04/27/13 1243     Chief Complaint  Patient presents with  . Migraine     (Consider location/radiation/quality/duration/timing/severity/associated sxs/prior Treatment) HPI Comments: Patient presents with a migraine. She has a long-standing history of migraines and fibromyalgia. She's followed by a neurologist. She states her migraines have been worsening over the last month. She describes a constant throbbing pain to the right side of her head and behind her right eye. It has been going off and on for about the last week. She does have some neck pain but it's chronic and unchanged from her baseline. She denies any fevers or chills. She does have some nausea but no vomiting. She states her headache is typical for her normal migraines. She was seen by her neurologist last week and given a shot of Toradol and Demerol which helped but then the headache came back again.  Patient is a 33 y.o. female presenting with migraines.  Migraine Associated symptoms include headaches. Pertinent negatives include no chest pain, no abdominal pain and no shortness of breath.    Past Medical History  Diagnosis Date  . 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 Kidney stones   . Asthma   . Carpal tunnel syndrome of right wrist   . Headache(784.0)   . GERD (gastroesophageal reflux disease)   . Post partum depression   . Depression   . Arthritis    Past Surgical History  Procedure Laterality Date  . Cesarean section    . Lithotripsy      stint and stone removal ( ureter kinked)  . Cesarean section  01/09/2011    Procedure: CESAREAN SECTION;  Surgeon: Esmeralda Arthur, MD;  Location: [facility];  Service: Gynecology;  Laterality: N/A;   Family History   Problem Relation Age of Onset  . Cancer Mother   . Depression Mother   . Hypertension Mother   . Hypertension Father   . Depression Maternal Grandmother   . Hypertension Maternal Grandmother    History  Substance Use Topics  . Smoking status: Never Smoker   . Smokeless tobacco: Never Used  . Alcohol Use: No   OB History   Grav Para Term Preterm Abortions TAB SAB Ect Mult Living   3 3 1 2      2      Review of Systems  Constitutional: Positive for fatigue. Negative for fever, chills and diaphoresis.  HENT: Negative for congestion, rhinorrhea and sneezing.   Eyes: Positive for photophobia.  Respiratory: Negative for cough, chest tightness and shortness of breath.   Cardiovascular: Negative for chest pain and leg swelling.  Gastrointestinal: Positive for nausea. Negative for vomiting, abdominal pain, diarrhea and blood in stool.  Genitourinary: Negative for frequency, hematuria, flank pain and difficulty urinating.  Musculoskeletal: Positive for arthralgias and myalgias. Negative for back pain.  Skin: Negative for rash.  Neurological: Positive for headaches. Negative for dizziness, speech difficulty, weakness and numbness.      Allergies  Gabapentin; Abilify; Augmentin; and Imitrex  Home Medications   Current Outpatient Rx  Name  Route  Sig  Dispense  Refill  . metoprolol tartrate (LOPRESSOR) 25 MG tablet   Oral   Take 25 mg by mouth 2 (two) times daily.         Marland Kitchen  albuterol (PROVENTIL HFA;VENTOLIN HFA) 108 (90 BASE) MCG/ACT inhaler   Inhalation   Inhale 2 puffs into the lungs every 4 (four) hours as needed for wheezing.         . diphenhydrAMINE (BENADRYL) 12.5 MG/5ML elixir   Oral   Take 50 mg by mouth once.         . diphenhydrAMINE (BENADRYL) 25 MG tablet   Oral   Take 1 tablet (25 mg total) by mouth every 6 (six) hours.   20 tablet   0   . DULoxetine (CYMBALTA) 60 MG capsule   Oral   Take 60 mg by mouth daily.         . famotidine (PEPCID) 20  MG tablet   Oral   Take 1 tablet (20 mg total) by mouth 2 (two) times daily.   14 tablet   0   . gabapentin (NEURONTIN) 100 MG capsule   Oral   Take 100 mg by mouth 3 (three) times daily.         Marland Kitchen. gabapentin (NEURONTIN) 300 MG capsule   Oral   Take 300 mg by mouth at bedtime.         Marland Kitchen. HYDROcodone-acetaminophen (NORCO/VICODIN) 5-325 MG per tablet   Oral   Take 1-2 tablets by mouth every 6 (six) hours as needed for pain.   20 tablet   0    BP 140/82  Pulse 78  Temp(Src) 98.4 F (36.9 C) (Oral)  Resp 18  Ht 5\' 3"  (1.6 m)  Wt 244 lb (110.678 kg)  BMI 43.23 kg/m2  SpO2 99% Physical Exam  Constitutional: She is oriented to person, place, and time. She appears well-developed and well-nourished.  HENT:  Head: Normocephalic and atraumatic.  Eyes: Pupils are equal, round, and reactive to light.  Fundi not well visualized  Neck: Normal range of motion. Neck supple.  No meningeal signs  Cardiovascular: Normal rate, regular rhythm and normal heart sounds.   Pulmonary/Chest: Effort normal and breath sounds normal. No respiratory distress. She has no wheezes. She has no rales. She exhibits no tenderness.  Abdominal: Soft. Bowel sounds are normal. There is no tenderness. There is no rebound and no guarding.  Musculoskeletal: Normal range of motion. She exhibits no edema.  Lymphadenopathy:    She has no cervical adenopathy.  Neurological: She is alert and oriented to person, place, and time. She has normal strength. No cranial nerve deficit or sensory deficit. GCS eye subscore is 4. GCS verbal subscore is 5. GCS motor subscore is 6.  Skin: Skin is warm and dry. No rash noted.  Psychiatric: She has a normal mood and affect.    ED Course  Procedures (including critical care time) Labs Review Labs Reviewed - No data to display Imaging Review No results found.  EKG Interpretation   None       MDM   Final diagnoses:  Migraine    Patient presents with migraine. Her  pain is typical for her usual migraines. There is no unusual symptoms that would be more suggestive of subarachnoid hemorrhage or meningitis. She was given a migraine cocktail and feels much better with that. She was discharged home to followup with her neurologist.    Rolan BuccoMelanie Yamila Cragin, MD 04/27/13 1504

## 2013-04-27 NOTE — Discharge Instructions (Signed)
Migraine Headache A migraine headache is an intense, throbbing pain on one or both sides of your head. A migraine can last for 30 minutes to several hours. CAUSES  The exact cause of a migraine headache is not always known. However, a migraine may be caused when nerves in the brain become irritated and release chemicals that cause inflammation. This causes pain. Certain things may also trigger migraines, such as:  Alcohol.  Smoking.  Stress.  Menstruation.  Aged cheeses.  Foods or drinks that contain nitrates, glutamate, aspartame, or tyramine.  Lack of sleep.  Chocolate.  Caffeine.  Hunger.  Physical exertion.  Fatigue.  Medicines used to treat chest pain (nitroglycerine), birth control pills, estrogen, and some blood pressure medicines. SIGNS AND SYMPTOMS  Pain on one or both sides of your head.  Pulsating or throbbing pain.  Severe pain that prevents daily activities.  Pain that is aggravated by any physical activity.  Nausea, vomiting, or both.  Dizziness.  Pain with exposure to bright lights, loud noises, or activity.  General sensitivity to bright lights, loud noises, or smells. Before you get a migraine, you may get warning signs that a migraine is coming (aura). An aura may include:  Seeing flashing lights.  Seeing bright spots, halos, or zig-zag lines.  Having tunnel vision or blurred vision.  Having feelings of numbness or tingling.  Having trouble talking.  Having muscle weakness. DIAGNOSIS  A migraine headache is often diagnosed based on:  Symptoms.  Physical exam.  A CT scan or MRI of your head. These imaging tests cannot diagnose migraines, but they can help rule out other causes of headaches. TREATMENT Medicines may be given for pain and nausea. Medicines can also be given to help prevent recurrent migraines.  HOME CARE INSTRUCTIONS  Only take over-the-counter or prescription medicines for pain or discomfort as directed by your  health care provider. The use of long-term narcotics is not recommended.  Lie down in a dark, quiet room when you have a migraine.  Keep a journal to find out what may trigger your migraine headaches. For example, write down:  What you eat and drink.  How much sleep you get.  Any change to your diet or medicines.  Limit alcohol consumption.  Quit smoking if you smoke.  Get 7 9 hours of sleep, or as recommended by your health care provider.  Limit stress.  Keep lights dim if bright lights bother you and make your migraines worse. SEEK IMMEDIATE MEDICAL CARE IF:   Your migraine becomes severe.  You have a fever.  You have a stiff neck.  You have vision loss.  You have muscular weakness or loss of muscle control.  You start losing your balance or have trouble walking.  You feel faint or pass out.  You have severe symptoms that are different from your first symptoms. MAKE SURE YOU:   Understand these instructions.  Will watch your condition.  Will get help right away if you are not doing well or get worse. Document Released: 03/05/2005 Document Revised: 12/24/2012 Document Reviewed: 11/10/2012 [employer] Patient Information 2014 [employer].  

## 2013-04-27 NOTE — ED Notes (Signed)
Pt is smiling and talking with this rn and husband.

## 2013-04-27 NOTE — ED Notes (Signed)
Pt amb to room 12 with quick steady gait in nad. Pt reports having headache x 1 month off and on, states she saw her neurologist last Wednesday, was given toradol and demerol.

## 2013-04-29 ENCOUNTER — Encounter (HOSPITAL_BASED_OUTPATIENT_CLINIC_OR_DEPARTMENT_OTHER): Payer: Self-pay | Admitting: Emergency Medicine

## 2013-04-29 ENCOUNTER — Emergency Department (HOSPITAL_BASED_OUTPATIENT_CLINIC_OR_DEPARTMENT_OTHER): Payer: BC Managed Care – PPO

## 2013-04-29 ENCOUNTER — Emergency Department (HOSPITAL_BASED_OUTPATIENT_CLINIC_OR_DEPARTMENT_OTHER)
Admission: EM | Admit: 2013-04-29 | Discharge: 2013-04-29 | Disposition: A | Discharge status: Home / Self Care | Payer: BC Managed Care – PPO | Attending: Emergency Medicine | Admitting: Emergency Medicine

## 2013-04-29 DIAGNOSIS — G43909 Migraine, unspecified, not intractable, without status migrainosus: Secondary | ICD-10-CM | POA: Insufficient documentation

## 2013-04-29 DIAGNOSIS — M129 Arthropathy, unspecified: Secondary | ICD-10-CM | POA: Insufficient documentation

## 2013-04-29 DIAGNOSIS — F3289 Other specified depressive episodes: Secondary | ICD-10-CM | POA: Insufficient documentation

## 2013-04-29 DIAGNOSIS — Z79899 Other long term (current) drug therapy: Secondary | ICD-10-CM | POA: Insufficient documentation

## 2013-04-29 DIAGNOSIS — F329 Major depressive disorder, single episode, unspecified: Secondary | ICD-10-CM | POA: Insufficient documentation

## 2013-04-29 DIAGNOSIS — J45909 Unspecified asthma, uncomplicated: Secondary | ICD-10-CM | POA: Insufficient documentation

## 2013-04-29 DIAGNOSIS — Z87442 Personal history of urinary calculi: Secondary | ICD-10-CM | POA: Insufficient documentation

## 2013-04-29 DIAGNOSIS — K219 Gastro-esophageal reflux disease without esophagitis: Secondary | ICD-10-CM | POA: Insufficient documentation

## 2013-04-29 DIAGNOSIS — Z8669 Personal history of other diseases of the nervous system and sense organs: Secondary | ICD-10-CM | POA: Insufficient documentation

## 2013-04-29 MED ORDER — METOCLOPRAMIDE HCL 5 MG/ML IJ SOLN
10.0000 mg | Freq: Once | INTRAMUSCULAR | Status: AC
  Administered 2013-04-29: 10 mg via INTRAMUSCULAR
  Filled 2013-04-29: qty 2

## 2013-04-29 MED ORDER — DIVALPROEX SODIUM 250 MG PO DR TAB
500.0000 mg | DELAYED_RELEASE_TABLET | Freq: Two times a day (BID) | ORAL | Status: DC
  Administered 2013-04-29: 500 mg via ORAL
  Filled 2013-04-29: qty 2

## 2013-04-29 MED ORDER — PROMETHAZINE HCL 25 MG/ML IJ SOLN: 12.5000 mg | Freq: Once | INTRAMUSCULAR | Status: DC

## 2013-04-29 MED ORDER — DIPHENHYDRAMINE HCL 50 MG/ML IJ SOLN
25.0000 mg | Freq: Once | INTRAMUSCULAR | Status: AC
  Administered 2013-04-29: 25 mg via INTRAMUSCULAR
  Filled 2013-04-29: qty 1

## 2013-04-29 MED ORDER — PROMETHAZINE HCL 25 MG/ML IJ SOLN
12.5000 mg | Freq: Once | INTRAMUSCULAR | Status: AC
  Administered 2013-04-29: 12.5 mg via INTRAMUSCULAR
  Filled 2013-04-29: qty 1

## 2013-04-29 MED ORDER — ONDANSETRON 8 MG PO TBDP
8.0000 mg | ORAL_TABLET | Freq: Once | ORAL | Status: AC
  Administered 2013-04-29: 8 mg via ORAL
  Filled 2013-04-29: qty 1

## 2013-04-29 MED ORDER — KETOROLAC TROMETHAMINE 60 MG/2ML IM SOLN
60.0000 mg | Freq: Once | INTRAMUSCULAR | Status: AC
  Administered 2013-04-29: 60 mg via INTRAMUSCULAR
  Filled 2013-04-29: qty 2

## 2013-04-29 NOTE — Discharge Instructions (Signed)
Migraine Headache A migraine headache is an intense, throbbing pain on one or both sides of your head. A migraine can last for 30 minutes to several hours. CAUSES  The exact cause of a migraine headache is not always known. However, a migraine may be caused when nerves in the brain become irritated and release chemicals that cause inflammation. This causes pain. Certain things may also trigger migraines, such as:  Alcohol.  Smoking.  Stress.  Menstruation.  Aged cheeses.  Foods or drinks that contain nitrates, glutamate, aspartame, or tyramine.  Lack of sleep.  Chocolate.  Caffeine.  Hunger.  Physical exertion.  Fatigue.  Medicines used to treat chest pain (nitroglycerine), birth control pills, estrogen, and some blood pressure medicines. SIGNS AND SYMPTOMS  Pain on one or both sides of your head.  Pulsating or throbbing pain.  Severe pain that prevents daily activities.  Pain that is aggravated by any physical activity.  Nausea, vomiting, or both.  Dizziness.  Pain with exposure to bright lights, loud noises, or activity.  General sensitivity to bright lights, loud noises, or smells. Before you get a migraine, you may get warning signs that a migraine is coming (aura). An aura may include:  Seeing flashing lights.  Seeing bright spots, halos, or zig-zag lines.  Having tunnel vision or blurred vision.  Having feelings of numbness or tingling.  Having trouble talking.  Having muscle weakness. DIAGNOSIS  A migraine headache is often diagnosed based on:  Symptoms.  Physical exam.  A CT scan or MRI of your head. These imaging tests cannot diagnose migraines, but they can help rule out other causes of headaches. TREATMENT Medicines may be given for pain and nausea. Medicines can also be given to help prevent recurrent migraines.  HOME CARE INSTRUCTIONS  Only take over-the-counter or prescription medicines for pain or discomfort as directed by your  health care provider. The use of long-term narcotics is not recommended.  Lie down in a dark, quiet room when you have a migraine.  Keep a journal to find out what may trigger your migraine headaches. For example, write down:  What you eat and drink.  How much sleep you get.  Any change to your diet or medicines.  Limit alcohol consumption.  Quit smoking if you smoke.  Get 7 9 hours of sleep, or as recommended by your health care provider.  Limit stress.  Keep lights dim if bright lights bother you and make your migraines worse. SEEK IMMEDIATE MEDICAL CARE IF:   Your migraine becomes severe.  You have a fever.  You have a stiff neck.  You have vision loss.  You have muscular weakness or loss of muscle control.  You start losing your balance or have trouble walking.  You feel faint or pass out.  You have severe symptoms that are different from your first symptoms. MAKE SURE YOU:   Understand these instructions.  Will watch your condition.  Will get help right away if you are not doing well or get worse. Document Released: 03/05/2005 Document Revised: 12/24/2012 Document Reviewed: 11/10/2012 [employer] Patient Information 2014 [employer].  

## 2013-04-29 NOTE — ED Notes (Signed)
Pt states that she was seen here earlier in week for migraine, states that improved but then returned suddenly last night and has progressively worsened

## 2013-04-29 NOTE — ED Provider Notes (Signed)
CSN: [id number]     Arrival date & time 04/29/13  [id number] History   First MD Initiated Contact with Patient 04/29/13 [phone]     Chief Complaint  Patient presents with  . Migraine     (Consider location/radiation/quality/duration/timing/severity/associated sxs/prior Treatment) Patient is a 33 y.o. female presenting with migraines. The history is provided by the patient.  Migraine This is a recurrent problem. The current episode started 3 to 5 hours ago. The problem occurs constantly. The problem has been gradually worsening. Pertinent negatives include no chest pain, no abdominal pain, no headaches and no shortness of breath. Nothing aggravates the symptoms. Nothing relieves the symptoms. She has tried nothing for the symptoms. The treatment provided no relief.  No fevers, no neck stiffness.  Has some nausea.  No emesis.  No trauma.    Past Medical History  Diagnosis Date  . 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 Kidney stones   . Asthma   . Carpal tunnel syndrome of right wrist   . Headache(784.0)   . GERD (gastroesophageal reflux disease)   . Post partum depression   . Depression   . Arthritis    Past Surgical History  Procedure Laterality Date  . Cesarean section    . Lithotripsy      stint and stone removal ( ureter kinked)  . Cesarean section  01/09/2011    Procedure: CESAREAN SECTION;  Surgeon: Esmeralda ArthurSandra A Rivard, MD;  Location: [facility];  Service: Gynecology;  Laterality: N/A;   Family History  Problem Relation Age of Onset  . Cancer Mother   . Depression Mother   . Hypertension Mother   . Hypertension Father   . Depression Maternal Grandmother   . Hypertension Maternal Grandmother    History  Substance Use Topics  . Smoking status: Never Smoker   . Smokeless tobacco: Never Used  . Alcohol Use: No   OB History   Grav Para Term Preterm Abortions TAB SAB Ect Mult Living   3 3 1 2      2      Review of Systems  Respiratory: Negative for shortness of breath.   Cardiovascular: Negative for chest pain.  Gastrointestinal: Negative for abdominal pain.  Neurological: Negative for headaches.  All other systems reviewed and are negative.      Allergies  Gabapentin; Abilify; Augmentin; and Imitrex  Home Medications   Current Outpatient Rx  Name  Route  Sig  Dispense  Refill  . albuterol (PROVENTIL HFA;VENTOLIN HFA) 108 (90 BASE) MCG/ACT inhaler   Inhalation   Inhale 2 puffs into the lungs every 4 (four) hours as needed for wheezing.         . diphenhydrAMINE (BENADRYL) 12.5 MG/5ML elixir   Oral   Take 50 mg by mouth once.         . diphenhydrAMINE (BENADRYL) 25 MG tablet   Oral   Take 1 tablet (25 mg total) by mouth every 6 (six) hours.   20 tablet   0   . DULoxetine (CYMBALTA) 60 MG capsule   Oral   Take 60 mg by mouth daily.         . famotidine (PEPCID) 20 MG tablet   Oral   Take 1 tablet (20 mg total) by mouth 2 (two) times daily.   14 tablet   0   . gabapentin (NEURONTIN)  100 MG capsule   Oral   Take 100 mg by mouth 3 (three) times daily.         Marland Kitchen gabapentin (NEURONTIN) 300 MG capsule   Oral   Take 300 mg by mouth at bedtime.         Marland Kitchen HYDROcodone-acetaminophen (NORCO/VICODIN) 5-325 MG per tablet   Oral   Take 1-2 tablets by mouth every 6 (six) hours as needed for pain.   20 tablet   0   . metoprolol tartrate (LOPRESSOR) 25 MG tablet   Oral   Take 25 mg by mouth 2 (two) times daily.          BP 139/79  Pulse 82  Temp(Src) 98.2 F (36.8 C) (Oral)  Resp 20  Wt 244 lb (110.678 kg)  SpO2 100% Physical Exam  Constitutional: She is oriented to person, place, and time. She appears well-developed and well-nourished. No distress.  HENT:  Head: Normocephalic and atraumatic.  Mouth/Throat: Oropharynx is clear and moist.  Eyes: Conjunctivae and EOM are  normal. Pupils are equal, round, and reactive to light.  Neck: Normal range of motion. Neck supple. No tracheal deviation present. No thyromegaly present.  FROM no meningismus  Cardiovascular: Normal rate, regular rhythm and intact distal pulses.   Pulmonary/Chest: Effort normal and breath sounds normal. No stridor. She has no wheezes. She has no rales.  Abdominal: Soft. Bowel sounds are normal. There is no tenderness. There is no rebound and no guarding.  Musculoskeletal: Normal range of motion.  Normal gait to the room  Lymphadenopathy:    She has no cervical adenopathy.  Neurological: She is alert and oriented to person, place, and time. She has normal reflexes. No cranial nerve deficit.  5/5 strength in all 4 extremities.  Sensation intact on exam in all 4 extremities  Skin: Skin is warm and dry. She is not diaphoretic.  Psychiatric: She has a normal mood and affect.    ED Course  Procedures (including critical care time) Labs Review Labs Reviewed - No data to display Imaging Review Ct Head Wo Contrast  04/29/2013   CLINICAL DATA:  Migraine, headache, nausea, right eye blurry vision.  EXAM: CT HEAD WITHOUT CONTRAST  TECHNIQUE: Contiguous axial images were obtained from the base of the skull through the vertex without intravenous contrast.  COMPARISON:  None available for comparison at time of study interpretation.  FINDINGS: The ventricles and sulci are normal. No intraparenchymal hemorrhage, mass effect nor midline shift. No acute large vascular territory infarcts.  No abnormal extra-axial fluid collections. Basal cisterns are patent. Mild calcific atherosclerosis of the carotid siphons and included vertebral arteries.  No skull fracture. Visualized paranasal sinuses and mastoid air-cells are well-aerated. The included ocular globes and orbital contents are non-suspicious.  IMPRESSION: No acute intracranial process.  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    Electronically Signed   By: Awilda Metro   On: 04/29/2013 06:36    EKG Interpretation   None       MDM   Final diagnoses:  None    Head CT is negative and within 6 hours of the onset of symptoms this rules out intracranial bleed.  No change in cognition.  Patient has a neurologist and she should follow up with them for medication adjustment.  There is no indication for LP in this patient.      Laura Awe, MD 04/29/13 [phone]

## 2013-10-04 ENCOUNTER — Encounter (HOSPITAL_BASED_OUTPATIENT_CLINIC_OR_DEPARTMENT_OTHER): Payer: Self-pay | Admitting: Emergency Medicine

## 2013-10-04 ENCOUNTER — Emergency Department (HOSPITAL_BASED_OUTPATIENT_CLINIC_OR_DEPARTMENT_OTHER)
Admission: EM | Admit: 2013-10-04 | Discharge: 2013-10-04 | Disposition: A | Discharge status: Home / Self Care | Payer: BC Managed Care – PPO | Attending: Emergency Medicine | Admitting: Emergency Medicine

## 2013-10-04 ENCOUNTER — Emergency Department (HOSPITAL_BASED_OUTPATIENT_CLINIC_OR_DEPARTMENT_OTHER): Payer: BC Managed Care – PPO

## 2013-10-04 DIAGNOSIS — F329 Major depressive disorder, single episode, unspecified: Secondary | ICD-10-CM | POA: Insufficient documentation

## 2013-10-04 DIAGNOSIS — Z8669 Personal history of other diseases of the nervous system and sense organs: Secondary | ICD-10-CM | POA: Diagnosis not present

## 2013-10-04 DIAGNOSIS — Z792 Long term (current) use of antibiotics: Secondary | ICD-10-CM | POA: Diagnosis not present

## 2013-10-04 DIAGNOSIS — B9689 Other specified bacterial agents as the cause of diseases classified elsewhere: Secondary | ICD-10-CM | POA: Diagnosis not present

## 2013-10-04 DIAGNOSIS — Z87442 Personal history of urinary calculi: Secondary | ICD-10-CM | POA: Diagnosis not present

## 2013-10-04 DIAGNOSIS — E669 Obesity, unspecified: Secondary | ICD-10-CM | POA: Insufficient documentation

## 2013-10-04 DIAGNOSIS — M129 Arthropathy, unspecified: Secondary | ICD-10-CM | POA: Diagnosis not present

## 2013-10-04 DIAGNOSIS — J45909 Unspecified asthma, uncomplicated: Secondary | ICD-10-CM | POA: Insufficient documentation

## 2013-10-04 DIAGNOSIS — R109 Unspecified abdominal pain: Secondary | ICD-10-CM | POA: Insufficient documentation

## 2013-10-04 DIAGNOSIS — Z3202 Encounter for pregnancy test, result negative: Secondary | ICD-10-CM | POA: Insufficient documentation

## 2013-10-04 DIAGNOSIS — Z79899 Other long term (current) drug therapy: Secondary | ICD-10-CM | POA: Diagnosis not present

## 2013-10-04 DIAGNOSIS — K219 Gastro-esophageal reflux disease without esophagitis: Secondary | ICD-10-CM | POA: Diagnosis not present

## 2013-10-04 DIAGNOSIS — F3289 Other specified depressive episodes: Secondary | ICD-10-CM | POA: Insufficient documentation

## 2013-10-04 DIAGNOSIS — A499 Bacterial infection, unspecified: Secondary | ICD-10-CM | POA: Insufficient documentation

## 2013-10-04 DIAGNOSIS — N760 Acute vaginitis: Secondary | ICD-10-CM | POA: Insufficient documentation

## 2013-10-04 HISTORY — DX: Obesity, unspecified: E66.9

## 2013-10-04 HISTORY — DX: Fibromyalgia: M79.7

## 2013-10-04 LAB — CBC WITH DIFFERENTIAL/PLATELET
BASOS PCT: 0 % (ref 0–1)
Basophils Absolute: 0 10*3/uL (ref 0.0–0.1)
EOS ABS: 0.2 10*3/uL (ref 0.0–0.7)
EOS PCT: 2 % (ref 0–5)
HCT: 33.9 % — ABNORMAL LOW (ref 36.0–46.0)
HCT: 34.5 % — ABNORMAL LOW (ref 36.0–46.0)
HEMOGLOBIN: 10.9 g/dL — AB (ref 12.0–15.0)
Hemoglobin: 11.2 g/dL — ABNORMAL LOW (ref 12.0–15.0)
LYMPHS ABS: 2.2 10*3/uL (ref 0.7–4.0)
Lymphocytes Relative: 25 % (ref 12–46)
MCH: 26.3 pg (ref 26.0–34.0)
MCH: 26.5 pg (ref 26.0–34.0)
MCHC: 32.2 g/dL (ref 30.0–36.0)
MCHC: 32.5 g/dL (ref 30.0–36.0)
MCV: 81.9 fL (ref 78.0–100.0)
MCV: 81.6 fL (ref 78.0–100.0)
Monocytes Absolute: 0.6 10*3/uL (ref 0.1–1.0)
Monocytes Relative: 7 % (ref 3–12)
NEUTROS PCT: 65 % (ref 43–77)
Neutro Abs: 5.6 10*3/uL (ref 1.7–7.7)
PLATELETS: 146 10*3/uL — AB (ref 150–400)
PLATELETS: 274 10*3/uL (ref 150–400)
RBC: 4.14 MIL/uL (ref 3.87–5.11)
RBC: 4.23 MIL/uL (ref 3.87–5.11)
RDW: 16 % — ABNORMAL HIGH (ref 11.5–15.5)
RDW: 16 % — ABNORMAL HIGH (ref 11.5–15.5)
WBC: 8.6 10*3/uL (ref 4.0–10.5)
WBC: 8.6 10*3/uL (ref 4.0–10.5)

## 2013-10-04 LAB — URINALYSIS, ROUTINE W REFLEX MICROSCOPIC
Bilirubin Urine: NEGATIVE
GLUCOSE, UA: NEGATIVE mg/dL
Hgb urine dipstick: NEGATIVE
KETONES UR: NEGATIVE mg/dL
LEUKOCYTES UA: NEGATIVE
Nitrite: NEGATIVE
PH: 6 (ref 5.0–8.0)
Protein, ur: NEGATIVE mg/dL
SPECIFIC GRAVITY, URINE: 1.022 (ref 1.005–1.030)
Urobilinogen, UA: 0.2 mg/dL (ref 0.0–1.0)

## 2013-10-04 LAB — WET PREP, GENITAL: TRICH WET PREP: NONE SEEN

## 2013-10-04 LAB — BASIC METABOLIC PANEL
ANION GAP: 14 (ref 5–15)
BUN: 8 mg/dL (ref 6–23)
CALCIUM: 9.4 mg/dL (ref 8.4–10.5)
CHLORIDE: 105 meq/L (ref 96–112)
CO2: 20 meq/L (ref 19–32)
Creatinine, Ser: 0.8 mg/dL (ref 0.50–1.10)
GFR calc Af Amer: >90 mL/min (ref >90)
GFR calc non Af Amer: >90 mL/min (ref >90)
GLUCOSE: 82 mg/dL (ref 70–99)
POTASSIUM: 4.4 meq/L (ref 3.7–5.3)
SODIUM: 139 meq/L (ref 137–147)

## 2013-10-04 LAB — PREGNANCY, URINE: Preg Test, Ur: NEGATIVE

## 2013-10-04 MED ORDER — METRONIDAZOLE 500 MG PO TABS
500.0000 mg | ORAL_TABLET | Freq: Once | ORAL | Status: AC
  Administered 2013-10-04: 500 mg via ORAL
  Filled 2013-10-04: qty 1

## 2013-10-04 MED ORDER — KETOROLAC TROMETHAMINE 10 MG PO TABS: 10.0000 mg | ORAL_TABLET | Freq: Four times a day (QID) | ORAL | Status: DC | PRN

## 2013-10-04 MED ORDER — KETOROLAC TROMETHAMINE 15 MG/ML IJ SOLN
15.0000 mg | Freq: Once | INTRAMUSCULAR | Status: AC
  Administered 2013-10-04: 15 mg via INTRAVENOUS

## 2013-10-04 MED ORDER — KETOROLAC TROMETHAMINE 30 MG/ML IJ SOLN
15.0000 mg | Freq: Once | INTRAMUSCULAR | Status: DC
  Filled 2013-10-04: qty 1

## 2013-10-04 MED ORDER — METRONIDAZOLE 500 MG PO TABS: 500.0000 mg | ORAL_TABLET | Freq: Two times a day (BID) | ORAL | Status: DC

## 2013-10-04 NOTE — Discharge Instructions (Signed)
°  Do not combine ketorolac (toradol) with any other NSAID (motrin, ibuprofen, Advil, aleve , aspirin, naproxen etc.) Take fist ketorolac pill tomorrow, you have already had a shot today  Take your antibiotics as directed and to completion. You should never have any leftover antibiotics! Push fluids and stay well hydrated.   Do not drink alcohol while you are taking flagyl (metronidazole) because it will make you very sick.  Please follow with your primary care doctor in the next 2 days for a check-up. They must obtain records for further management.   Do not hesitate to return to the Emergency Department for any new, worsening or concerning symptoms.

## 2013-10-04 NOTE — ED Notes (Signed)
Pt here with right flank pain which is severe.  Pt woke up this am with feelings like she may have a UTI and then the pain became severe in her right flank.  Pt has hx of kidney stone.  Pain and burning with urination this am and pt has urgency and nausea

## 2013-10-04 NOTE — ED Provider Notes (Signed)
CSN: [id number]     Arrival date & time 10/04/13  1323 History   First MD Initiated Contact with Patient 10/04/13 1516     Chief Complaint  Patient presents with  . Flank Pain     (Consider location/radiation/quality/duration/timing/severity/associated sxs/prior Treatment) HPI  Laura Tucker is a 33 y.o. female complaining of dysuria onset this morning and right flank pain starting at around noon. States that this is not typical for her kidney stone exacerbations, state that they're usually on the left because she states "she has aching in the ureter.". Patient has had to have lithotripsy and stent placement in the past. She follows with a urologist in [place]. She's taken 800 mg of ibuprofen at home with little relief. She's never had a right-sided kidney stone before. Patient denies fever, chills, hematuria, nausea, vomiting, change in bowel or bladder habits, abnormal vaginal discharge.   Past Medical History  Diagnosis Date  . 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 Kidney stones   . Asthma   . Carpal tunnel syndrome of right wrist   . Headache(784.0)   . GERD (gastroesophageal reflux disease)   . Post partum depression   . Depression   . Arthritis   . Obesity   . Fibromyalgia    Past Surgical History  Procedure Laterality Date  . Cesarean section    . Lithotripsy      stint and stone removal ( ureter kinked)  . Cesarean section  01/09/2011    Procedure: CESAREAN SECTION;  Surgeon: Esmeralda Arthur, MD;  Location: [facility];  Service: Gynecology;  Laterality: N/A;   Family History  Problem Relation Age of Onset  . Cancer Mother   . Depression Mother   . Hypertension Mother   . Hypertension Father   . Depression Maternal Grandmother   . Hypertension Maternal Grandmother    History  Substance Use Topics  .  Smoking status: Never Smoker   . Smokeless tobacco: Never Used  . Alcohol Use: No   OB History   Grav Para Term Preterm Abortions TAB SAB Ect Mult Living   3 3 1 2      2      Review of Systems  10 systems reviewed and found to be negative, except as noted in the HPI.   Allergies  Gabapentin; Abilify; Augmentin; and Imitrex  Home Medications   Prior to Admission medications   Medication Sig Start Date End Date Taking? Authorizing Provider  amitriptyline (ELAVIL) 10 MG tablet Take 40 mg by mouth at bedtime.   Yes Historical Provider, MD  ibuprofen (ADVIL,MOTRIN) 800 MG tablet Take 800 mg by mouth every 8 (eight) hours as needed.   Yes Historical Provider, MD  albuterol (PROVENTIL HFA;VENTOLIN HFA) 108 (90 BASE) MCG/ACT inhaler Inhale 2 puffs into the lungs every 4 (four) hours as needed for wheezing.    Historical Provider, MD  diphenhydrAMINE (BENADRYL) 12.5 MG/5ML elixir Take 50 mg by mouth once.    Historical Provider, MD  diphenhydrAMINE (BENADRYL) 25 MG tablet Take 1 tablet (25 mg total) by mouth every 6 (six) hours. 06/04/12   Vanetta Mulders, MD  DULoxetine (CYMBALTA) 60 MG capsule Take 60 mg by mouth daily.    Historical Provider, MD  famotidine (PEPCID) 20 MG tablet Take 1 tablet (20 mg total) by mouth 2 (two) times daily. 06/04/12  Vanetta Mulders, MD  gabapentin (NEURONTIN) 100 MG capsule Take 100 mg by mouth 3 (three) times daily.    Historical Provider, MD  gabapentin (NEURONTIN) 300 MG capsule Take 300 mg by mouth at bedtime.    Historical Provider, MD  HYDROcodone-acetaminophen (NORCO/VICODIN) 5-325 MG per tablet Take 1-2 tablets by mouth every 6 (six) hours as needed for pain. 06/04/12   Vanetta Mulders, MD  ketorolac (TORADOL) 10 MG tablet Take 1 tablet (10 mg total) by mouth every 6 (six) hours as needed (Take with food. Do not take more than 4 per day. Do not take for longer than 5 days). 10/04/13   Rosan Calbert, PA-C  metoprolol tartrate (LOPRESSOR) 25 MG tablet  Take 25 mg by mouth 2 (two) times daily.    Historical Provider, MD  metroNIDAZOLE (FLAGYL) 500 MG tablet Take 1 tablet (500 mg total) by mouth 2 (two) times daily. One po bid x 7 days 10/04/13   Joni Reining Cadell Gabrielson, PA-C   BP 111/51  Pulse 79  Temp(Src) 98.2 F (36.8 C) (Oral)  Resp 20  Ht 5\' 3"  (1.6 m)  Wt 250 lb (113.399 kg)  BMI 44.30 kg/m2  SpO2 100% Physical Exam  Nursing note and vitals reviewed. Constitutional: She is oriented to person, place, and time. She appears well-developed and well-nourished. No distress.  Obese  HENT:  Head: Normocephalic.  Mouth/Throat: Oropharynx is clear and moist.  Eyes: Conjunctivae and EOM are normal.  Cardiovascular: Normal rate.   Pulmonary/Chest: Effort normal and breath sounds normal. No stridor. No respiratory distress. She has no wheezes. She has no rales. She exhibits no tenderness.  Abdominal: Soft. She exhibits no distension and no mass. There is tenderness. There is no rebound and no guarding.  Mild suprapubic tenderness to palpation with no guarding or rebound  Genitourinary:  Positive CVA tenderness to palpation on the right side.  Pelvic exam chaperoned by technician: No rashes or lesions, no abnormal vaginal discharge, no cervical or adnexal tenderness.  Musculoskeletal: Normal range of motion.  Neurological: She is alert and oriented to person, place, and time.  Psychiatric: She has a normal mood and affect.    ED Course  Procedures (including critical care time) Labs Review Labs Reviewed  WET PREP, GENITAL - Abnormal; Notable for the following:    Yeast Wet Prep HPF POC FEW (*)    Clue Cells Wet Prep HPF POC MODERATE (*)    WBC, Wet Prep HPF POC MANY (*)    All other components within normal limits  URINALYSIS, ROUTINE W REFLEX MICROSCOPIC - Abnormal; Notable for the following:    APPearance CLOUDY (*)    All other components within normal limits  CBC WITH DIFFERENTIAL - Abnormal; Notable for the following:     Hemoglobin 10.9 (*)    HCT 33.9 (*)    RDW 16.0 (*)    Platelets 146 (*)    All other components within normal limits  CBC WITH DIFFERENTIAL - Abnormal; Notable for the following:    Hemoglobin 11.2 (*)    HCT 34.5 (*)    RDW 16.0 (*)    All other components within normal limits  GC/CHLAMYDIA PROBE AMP  PREGNANCY, URINE  BASIC METABOLIC PANEL    Imaging Review US Renal  10/04/2013   CLINICAL DATA:  Right flank pain.  EXAM: RENAL/URINARY TRACT ULTRASOUND COMPLETE  COMPARISON:  None.  FINDINGS: Right Kidney:  Length: 10.5 cm. Echogenicity within normal limits. No mass or hydronephrosis visualized. Possible small nonobstructive calculus seen  in upper pole.  Left Kidney:  Length: 11.4 cm. Echogenicity within normal limits. No mass or hydronephrosis visualized. Possible small nonobstructive calculus seen in midpole.  Bladder:  Appears normal for degree of bladder distention. Bilateral ureteral jets are noted.  IMPRESSION: Possible small bilateral nonobstructive nephrolithiasis. No hydronephrosis or renal obstruction is noted.   Electronically Signed   By: Roque LiasJames  Green M.D.   On: 10/04/2013 16:02     EKG Interpretation None      MDM   Final diagnoses:  Acute right flank pain  Bacterial vaginosis    Filed Vitals:   10/04/13 1339 10/04/13 1634  BP: 127/80 111/51  Pulse: 91 79  Temp: 98.2 F (36.8 C)   TempSrc: Oral   Resp: 18 20  Height: 5\' 3"  (1.6 m)   Weight: 250 lb (113.399 kg)   SpO2: 100% 100%    Medications  ketorolac (TORADOL) 15 MG/ML injection 15 mg (15 mg Intravenous Given 10/04/13 1636)  metroNIDAZOLE (FLAGYL) tablet 500 mg (500 mg Oral Given 10/04/13 1852)    Laura Tucker is a 33 y.o. female presenting with dysuria and right flank pain. Urinalysis shows no signs of infection. Patient has extensive history of kidney stones however states they are normal in the left. Renal ultrasound shows no hydronephrosis. Wet prep shows clue cells. We'll treat for bacterial  vaginosis. Have explained to patient I do not think this is the cause of her pain. Patient is feeling much better after Toradol. Will write her for Toradol to go home with. Advised her it is critically important that she not take any other NSAID when taking Toradol. Patient and husband verbalized understanding. I have advised her to follow closely with her primary care physician.   Evaluation does not show pathology that would require ongoing emergent intervention or inpatient treatment. Pt is hemodynamically stable and mentating appropriately. Discussed findings and plan with patient/guardian, who agrees with care plan. All questions answered. Return precautions discussed and outpatient follow up given.   New Prescriptions   KETOROLAC (TORADOL) 10 MG TABLET    Take 1 tablet (10 mg total) by mouth every 6 (six) hours as needed (Take with food. Do not take more than 4 per day. Do not take for longer than 5 days).   METRONIDAZOLE (FLAGYL) 500 MG TABLET    Take 1 tablet (500 mg total) by mouth 2 (two) times daily. One po bid x 7 days         Wynetta Emeryicole Amanpreet Delmont, PA-C 10/04/13 1854

## 2013-10-04 NOTE — ED Notes (Signed)
Pt up to the bathroom

## 2013-10-05 LAB — GC/CHLAMYDIA PROBE AMP
CT PROBE, AMP APTIMA: NEGATIVE
GC PROBE AMP APTIMA: NEGATIVE

## 2013-10-05 NOTE — ED Provider Notes (Signed)
Medical screening examination/treatment/procedure(s) were performed by non-physician practitioner and as supervising physician I was immediately available for consultation/collaboration.   EKG Interpretation None       Laura HornJohn M Amiayah Giebel, MD 10/05/13 2211

## 2014-01-18 ENCOUNTER — Encounter (HOSPITAL_BASED_OUTPATIENT_CLINIC_OR_DEPARTMENT_OTHER): Payer: Self-pay | Admitting: Emergency Medicine

## 2014-04-11 ENCOUNTER — Emergency Department (HOSPITAL_COMMUNITY): Payer: BLUE CROSS/BLUE SHIELD

## 2014-04-11 ENCOUNTER — Emergency Department (HOSPITAL_COMMUNITY)
Admission: EM | Admit: 2014-04-11 | Discharge: 2014-04-11 | Disposition: A | Discharge status: Home / Self Care | Payer: BLUE CROSS/BLUE SHIELD | Attending: Emergency Medicine | Admitting: Emergency Medicine

## 2014-04-11 ENCOUNTER — Encounter (HOSPITAL_COMMUNITY): Payer: Self-pay

## 2014-04-11 DIAGNOSIS — Z79899 Other long term (current) drug therapy: Secondary | ICD-10-CM | POA: Insufficient documentation

## 2014-04-11 DIAGNOSIS — M797 Fibromyalgia: Secondary | ICD-10-CM | POA: Diagnosis not present

## 2014-04-11 DIAGNOSIS — M1990 Unspecified osteoarthritis, unspecified site: Secondary | ICD-10-CM | POA: Insufficient documentation

## 2014-04-11 DIAGNOSIS — Z8669 Personal history of other diseases of the nervous system and sense organs: Secondary | ICD-10-CM | POA: Insufficient documentation

## 2014-04-11 DIAGNOSIS — E669 Obesity, unspecified: Secondary | ICD-10-CM | POA: Diagnosis not present

## 2014-04-11 DIAGNOSIS — F329 Major depressive disorder, single episode, unspecified: Secondary | ICD-10-CM | POA: Diagnosis not present

## 2014-04-11 DIAGNOSIS — M549 Dorsalgia, unspecified: Secondary | ICD-10-CM

## 2014-04-11 DIAGNOSIS — Z3202 Encounter for pregnancy test, result negative: Secondary | ICD-10-CM | POA: Insufficient documentation

## 2014-04-11 DIAGNOSIS — R32 Unspecified urinary incontinence: Secondary | ICD-10-CM | POA: Diagnosis present

## 2014-04-11 DIAGNOSIS — M5136 Other intervertebral disc degeneration, lumbar region: Secondary | ICD-10-CM

## 2014-04-11 DIAGNOSIS — J45909 Unspecified asthma, uncomplicated: Secondary | ICD-10-CM | POA: Insufficient documentation

## 2014-04-11 DIAGNOSIS — Z8719 Personal history of other diseases of the digestive system: Secondary | ICD-10-CM | POA: Insufficient documentation

## 2014-04-11 DIAGNOSIS — Z87442 Personal history of urinary calculi: Secondary | ICD-10-CM | POA: Diagnosis not present

## 2014-04-11 DIAGNOSIS — R110 Nausea: Secondary | ICD-10-CM | POA: Insufficient documentation

## 2014-04-11 LAB — COMPREHENSIVE METABOLIC PANEL
ALK PHOS: 99 U/L (ref 39–117)
ALT: 25 U/L (ref 0–35)
ANION GAP: 5 (ref 5–15)
AST: 22 U/L (ref 0–37)
Albumin: 3.8 g/dL (ref 3.5–5.2)
BUN: 13 mg/dL (ref 6–23)
CALCIUM: 9.2 mg/dL (ref 8.4–10.5)
CHLORIDE: 105 mmol/L (ref 96–112)
CO2: 27 mmol/L (ref 19–32)
Creatinine, Ser: 0.92 mg/dL (ref 0.50–1.10)
GFR calc Af Amer: >90 mL/min (ref >90)
GFR calc non Af Amer: 80 mL/min — ABNORMAL LOW (ref >90)
GLUCOSE: 113 mg/dL — AB (ref 70–99)
Potassium: 3.7 mmol/L (ref 3.5–5.1)
Sodium: 137 mmol/L (ref 135–145)
Total Bilirubin: 0.5 mg/dL (ref 0.3–1.2)
Total Protein: 6.9 g/dL (ref 6.0–8.3)

## 2014-04-11 LAB — URINALYSIS, ROUTINE W REFLEX MICROSCOPIC
Bilirubin Urine: NEGATIVE
Glucose, UA: NEGATIVE mg/dL
Hgb urine dipstick: NEGATIVE
KETONES UR: NEGATIVE mg/dL
Leukocytes, UA: NEGATIVE
NITRITE: NEGATIVE
Protein, ur: NEGATIVE mg/dL
Specific Gravity, Urine: 1.02 (ref 1.005–1.030)
UROBILINOGEN UA: 0.2 mg/dL (ref 0.0–1.0)
pH: 6 (ref 5.0–8.0)

## 2014-04-11 LAB — CBC WITH DIFFERENTIAL/PLATELET
BASOS PCT: 0 % (ref 0–1)
Basophils Absolute: 0 10*3/uL (ref 0.0–0.1)
EOS ABS: 0 10*3/uL (ref 0.0–0.7)
Eosinophils Relative: 0 % (ref 0–5)
HCT: 40.6 % (ref 36.0–46.0)
Hemoglobin: 13.2 g/dL (ref 12.0–15.0)
LYMPHS ABS: 2.3 10*3/uL (ref 0.7–4.0)
Lymphocytes Relative: 16 % (ref 12–46)
MCH: 25.7 pg — ABNORMAL LOW (ref 26.0–34.0)
MCHC: 32.5 g/dL (ref 30.0–36.0)
MCV: 79.1 fL (ref 78.0–100.0)
Monocytes Absolute: 1.1 10*3/uL — ABNORMAL HIGH (ref 0.1–1.0)
Monocytes Relative: 8 % (ref 3–12)
NEUTROS ABS: 11 10*3/uL — AB (ref 1.7–7.7)
Neutrophils Relative %: 76 % (ref 43–77)
PLATELETS: 314 10*3/uL (ref 150–400)
RBC: 5.13 MIL/uL — AB (ref 3.87–5.11)
RDW: 16.5 % — ABNORMAL HIGH (ref 11.5–15.5)
WBC: 14.5 10*3/uL — AB (ref 4.0–10.5)

## 2014-04-11 LAB — POC URINE PREG, ED: PREG TEST UR: NEGATIVE

## 2014-04-11 MED ORDER — GADOBENATE DIMEGLUMINE 529 MG/ML IV SOLN
20.0000 mL | Freq: Once | INTRAVENOUS | Status: AC | PRN
  Administered 2014-04-11: 20 mL via INTRAVENOUS

## 2014-04-11 MED ORDER — OXYCODONE HCL 5 MG PO TABS: 5.0000 mg | ORAL_TABLET | ORAL | Status: DC | PRN

## 2014-04-11 MED ORDER — ONDANSETRON HCL 4 MG/2ML IJ SOLN
4.0000 mg | Freq: Once | INTRAMUSCULAR | Status: AC
  Administered 2014-04-11: 4 mg via INTRAVENOUS
  Filled 2014-04-11: qty 2

## 2014-04-11 MED ORDER — SODIUM CHLORIDE 0.9 % IV BOLUS (SEPSIS)
1000.0000 mL | Freq: Once | INTRAVENOUS | Status: AC
  Administered 2014-04-11: 1000 mL via INTRAVENOUS

## 2014-04-11 MED ORDER — HYDROMORPHONE HCL 1 MG/ML IJ SOLN
1.0000 mg | Freq: Once | INTRAMUSCULAR | Status: AC
  Administered 2014-04-11: 1 mg via INTRAVENOUS
  Filled 2014-04-11: qty 1

## 2014-04-11 MED ORDER — HYDROMORPHONE HCL 1 MG/ML IJ SOLN
0.5000 mg | Freq: Once | INTRAMUSCULAR | Status: AC
  Administered 2014-04-11: 0.5 mg via INTRAVENOUS
  Filled 2014-04-11: qty 1

## 2014-04-11 MED ORDER — FENTANYL CITRATE 0.05 MG/ML IJ SOLN
100.0000 ug | Freq: Once | INTRAMUSCULAR | Status: DC
Start: 2014-04-11 — End: 2014-04-11

## 2014-04-11 NOTE — ED Notes (Signed)
Pt complains of back. Stated that she went to have an epidural at [place] Tuesday and was placed on steroids. This morning after the pt woke up and the pt urinated on herself and was told if she had bladder problems to come be seen. Pt is still having back pain.

## 2014-04-11 NOTE — ED Provider Notes (Signed)
CSN: [id number]     Arrival date & time 04/11/14  4098 History   First MD Initiated Contact with Patient 04/11/14 [phone]     Chief Complaint  Patient presents with  . Back Pain  . Urinary Incontinence   HPI  Patient is a 34 y.o. Female with a PMH of kidney stones, depression, obesity, fibromyalgia, and migraines who presents to the ED with complaints of low back pain and loss of her bladder.  Patient states that she has been having back pain for many years and had an MRI performed which noted a bulging disc at L5-S1.  Patient had an epidural injection to help with her symptoms on Tuesday Jan 19th at [facility].  She states that since her injection she has had some improvement in her leg symptoms but now has a constant aching pain that radiates across her hips and up her spine to inbetween her shoulder blades.  She states that the pain is constant.  She went to see her neurologist the day after her injection and he started her on a prednisone taper orally.  She states that this is not helping her pain at all.  She has also been taking roxicodone as this helps her breakthrough pain.  Patient states that this morning she woke up and was laying in bed because she was in pain and she urinated on herself.  She denies the awareness of needing to urinate.  Patient states that she was also having some cramping lower abdominal pain.  Patient denies any dysuria, urinary frequency or urgency, or hematuria. She denies any vaginal discharge. She denies loss of her bowel. She denies diarrhea or constipation. She denies any leg weakness or loss of balance. She denies any saddle anesthesias. Patient has since urinated on her own with no difficulty.  Past Medical History  Diagnosis Date  . 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 Kidney stones   . Asthma    . Carpal tunnel syndrome of right wrist   . Headache(784.0)   . GERD (gastroesophageal reflux disease)   . Post partum depression   . Depression   . Arthritis   . Obesity   . Fibromyalgia    Past Surgical History  Procedure Laterality Date  . Cesarean section    . Lithotripsy      stint and stone removal ( ureter kinked)  . Cesarean section  01/09/2011    Procedure: CESAREAN SECTION;  Surgeon: Esmeralda Arthur, MD;  Location: [facility];  Service: Gynecology;  Laterality: N/A;   Family History  Problem Relation Age of Onset  . Cancer Mother   . Depression Mother   . Hypertension Mother   . Hypertension Father   . Depression Maternal Grandmother   . Hypertension Maternal Grandmother    History  Substance Use Topics  . Smoking status: Never Smoker   . Smokeless tobacco: Never Used  . Alcohol Use: No   OB History    Gravida Para Term Preterm AB TAB SAB Ectopic Multiple Living   Review of Systems  Constitutional: Negative for fever, chills and fatigue.  Respiratory: Negative for chest tightness, shortness of breath and wheezing.   Cardiovascular: Negative for chest pain and palpitations.  Gastrointestinal: Positive for nausea and abdominal pain. Negative for  vomiting, diarrhea and constipation.  Genitourinary: Positive for enuresis. Negative for dysuria, urgency, frequency, hematuria, decreased urine volume, vaginal bleeding, vaginal discharge and difficulty urinating.  Musculoskeletal: Positive for back pain. Negative for myalgias, arthralgias, gait problem, neck pain and neck stiffness.  Neurological: Negative for dizziness, weakness and numbness.  All other systems reviewed and are negative.     Allergies  Gabapentin; Abilify; Augmentin; and Imitrex  Home Medications   Prior to Admission medications   Medication Sig Start Date End Date Taking? Authorizing Provider  albuterol (PROVENTIL HFA;VENTOLIN HFA) 108 (90 BASE) MCG/ACT inhaler Inhale 2  puffs into the lungs every 4 (four) hours as needed for wheezing.    Historical Provider, MD  amitriptyline (ELAVIL) 10 MG tablet Take 40 mg by mouth at bedtime.    Historical Provider, MD  diphenhydrAMINE (BENADRYL) 12.5 MG/5ML elixir Take 50 mg by mouth once.    Historical Provider, MD  diphenhydrAMINE (BENADRYL) 25 MG tablet Take 1 tablet (25 mg total) by mouth every 6 (six) hours. 06/04/12   Vanetta Mulders, MD  DULoxetine (CYMBALTA) 60 MG capsule Take 60 mg by mouth daily.    Historical Provider, MD  famotidine (PEPCID) 20 MG tablet Take 1 tablet (20 mg total) by mouth 2 (two) times daily. 06/04/12   Vanetta Mulders, MD  gabapentin (NEURONTIN) 100 MG capsule Take 100 mg by mouth 3 (three) times daily.    Historical Provider, MD  gabapentin (NEURONTIN) 300 MG capsule Take 300 mg by mouth at bedtime.    Historical Provider, MD  HYDROcodone-acetaminophen (NORCO/VICODIN) 5-325 MG per tablet Take 1-2 tablets by mouth every 6 (six) hours as needed for pain. 06/04/12   Vanetta Mulders, MD  ibuprofen (ADVIL,MOTRIN) 800 MG tablet Take 800 mg by mouth every 8 (eight) hours as needed.    Historical Provider, MD  ketorolac (TORADOL) 10 MG tablet Take 1 tablet (10 mg total) by mouth every 6 (six) hours as needed (Take with food. Do not take more than 4 per day. Do not take for longer than 5 days). 10/04/13   Nicole Pisciotta, PA-C  metoprolol tartrate (LOPRESSOR) 25 MG tablet Take 25 mg by mouth 2 (two) times daily.    Historical Provider, MD  metroNIDAZOLE (FLAGYL) 500 MG tablet Take 1 tablet (500 mg total) by mouth 2 (two) times daily. One po bid x 7 days 10/04/13   Joni Reining Pisciotta, PA-C  oxyCODONE (ROXICODONE) 5 MG immediate release tablet Take 1 tablet (5 mg total) by mouth every 4 (four) hours as needed for severe pain. 04/11/14   Denvil Canning A Forcucci, PA-C   BP 121/62 mmHg  Pulse 88  Temp(Src) 98.2 F (36.8 C) (Oral)  Resp 16  SpO2 96% Physical Exam  Constitutional: She is oriented to person, place,  and time. She appears well-developed and well-nourished. No distress.  HENT:  Head: Normocephalic and atraumatic.  Mouth/Throat: Oropharynx is clear and moist. No oropharyngeal exudate.  Eyes: Conjunctivae and EOM are normal. Pupils are equal, round, and reactive to light. No scleral icterus.  Neck: Normal range of motion. Neck supple. No JVD present. No thyromegaly present.  Cardiovascular: Normal rate, regular rhythm, normal heart sounds and intact distal pulses.  Exam reveals no gallop and no friction rub.   No murmur heard. Pulmonary/Chest: Effort normal and breath sounds normal. No respiratory distress. She has no wheezes. She has no rales. She exhibits no tenderness.  Abdominal: Soft. Bowel sounds are normal. She exhibits no distension and no mass. There is no tenderness. There is  no rebound and no guarding.  Genitourinary: Rectum normal. Rectal exam shows no external hemorrhoid, no internal hemorrhoid, no fissure, no mass, no tenderness and anal tone normal.  Musculoskeletal:  Patient rises slowly from sitting to standing.  They walk without an antalgic gait.  There is no evidence of erythema, ecchymosis, or gross deformity.  There is tenderness to palpation over bony thoracic and lumbar spine.  There is also tenderness to palpation over the bilateral thoracic and lumbar paraspinal muscles.  Active ROM is limited due to pain.  Sensation to light touch is intact over all extremities.  Strength is symmetric and equal in all extremities.    Lymphadenopathy:    She has no cervical adenopathy.  Neurological: She is alert and oriented to person, place, and time. She has normal strength. No cranial nerve deficit or sensory deficit. Coordination and gait normal.  Skin: Skin is warm and dry. She is not diaphoretic.  Psychiatric: She has a normal mood and affect. Her behavior is normal. Judgment and thought content normal.  Nursing note and vitals reviewed.   ED Course  Procedures (including  critical care time) Labs Review Labs Reviewed  URINALYSIS, ROUTINE W REFLEX MICROSCOPIC - Abnormal; Notable for the following:    APPearance CLOUDY (*)    All other components within normal limits  CBC WITH DIFFERENTIAL/PLATELET - Abnormal; Notable for the following:    WBC 14.5 (*)    RBC 5.13 (*)    MCH 25.7 (*)    RDW 16.5 (*)    Neutro Abs 11.0 (*)    Monocytes Absolute 1.1 (*)    All other components within normal limits  COMPREHENSIVE METABOLIC PANEL - Abnormal; Notable for the following:    Glucose, Bld 113 (*)    GFR calc non Af Amer 80 (*)    All other components within normal limits  POC URINE PREG, ED    Imaging Review Mr Lumbar Spine W Wo Contrast  04/11/2014   CLINICAL DATA:  Back pain  EXAM: MRI LUMBAR SPINE WITHOUT AND WITH CONTRAST  TECHNIQUE: Multiplanar and multiecho pulse sequences of the lumbar spine were obtained without and with intravenous contrast.  CONTRAST:  20mL MULTIHANCE GADOBENATE DIMEGLUMINE 529 MG/ML IV SOLN  COMPARISON:  Lumbar MRI 01/26/2012  FINDINGS: Normal lumbar alignment. Negative for fracture or mass lesion. Bone marrow signal is diffusely low signal on T1 and T2 which is unchanged and may be related to cellular marrow. No focal mass lesion. The patient is not anemic based on recent CBC. Conus medullaris is normal and terminates at L1-2.  L1-2:  Negative  L2-3:  Negative  L3-4: Small right foraminal disc protrusion is unchanged. Mild facet degeneration also unchanged. Mild narrowing of the canal.  L4-5: Mild disc bulging and mild facet degeneration. Mild narrowing of the canal without significant spinal stenosis. No significant change from the prior MRI  L5-S1: Small right paracentral disc protrusion is unchanged. This is touching the right S1 nerve root without definite nerve root compression. Mild facet degeneration.  IMPRESSION: Bone marrow is diffusely low signal on T1 and T2 suggesting a a diffuse bone marrow process such as cellular or fibrotic  bone marrow. Obesity may be a factor in this finding.  Small right foraminal disc protrusion L3-4 is unchanged.  Small right paracentral disc protrusion L5-S1 unchanged.   Electronically Signed   By: Marlan Palau M.D.   On: 04/11/2014 11:38     EKG Interpretation None      MDM  Final diagnoses:  Back pain  Degenerative disc disease, lumbar  Obesity   Patient is a 34 year old female presents emergency room for evaluation of back pain after epidural injections. There is no neurological deficits on physical exam. Rectal tone is normal. Given her recent history of epidural and loss of bladder and history MRI with and without contrast was ordered to rule out cauda equina and epidural abscess. MRI is negative for epidural abscess and cauda equina at this time. Patient does have continued bulging disks at L3-L4 and L5-S1. This seems unchanged. UA is negative. CBC reveals mild leukocytosis which is likely due to prednisone use. CMP is unremarkable. Pain was treated here with IV Dilaudid and Zofran. Given normal MRI will discharge home to follow-up with her doctor. We'll discharge with 2 days worth of Roxicodone. Patient to follow-up with her doctors. She is to return for saddle anesthesias, further intractable loss of bowel or bladder, or any other concerning symptoms. She states understanding and agreement at this time. Patient seen by and discussed with Dr. Silverio LayYao who agrees with the above workup and plan. Patient stable for discharge.   Eben Burowourtney A Forcucci, PA-C 04/11/14 1218  Richardean Canalavid H Yao, MD 04/12/14 [phone]

## 2014-04-11 NOTE — Discharge Instructions (Signed)

## 2015-05-03 ENCOUNTER — Emergency Department (HOSPITAL_COMMUNITY): Payer: Self-pay

## 2015-05-03 ENCOUNTER — Emergency Department (HOSPITAL_COMMUNITY)
Admission: EM | Admit: 2015-05-03 | Discharge: 2015-05-03 | Disposition: A | Discharge status: Home / Self Care | Payer: Self-pay | Attending: Emergency Medicine | Admitting: Emergency Medicine

## 2015-05-03 ENCOUNTER — Encounter (HOSPITAL_COMMUNITY): Payer: Self-pay | Admitting: Emergency Medicine

## 2015-05-03 DIAGNOSIS — F329 Major depressive disorder, single episode, unspecified: Secondary | ICD-10-CM | POA: Insufficient documentation

## 2015-05-03 DIAGNOSIS — K219 Gastro-esophageal reflux disease without esophagitis: Secondary | ICD-10-CM | POA: Insufficient documentation

## 2015-05-03 DIAGNOSIS — E669 Obesity, unspecified: Secondary | ICD-10-CM | POA: Insufficient documentation

## 2015-05-03 DIAGNOSIS — Z79899 Other long term (current) drug therapy: Secondary | ICD-10-CM | POA: Insufficient documentation

## 2015-05-03 DIAGNOSIS — J45901 Unspecified asthma with (acute) exacerbation: Secondary | ICD-10-CM | POA: Insufficient documentation

## 2015-05-03 DIAGNOSIS — Z87442 Personal history of urinary calculi: Secondary | ICD-10-CM | POA: Insufficient documentation

## 2015-05-03 DIAGNOSIS — R079 Chest pain, unspecified: Secondary | ICD-10-CM | POA: Insufficient documentation

## 2015-05-03 LAB — CBC
HCT: 39.4 % (ref 36.0–46.0)
Hemoglobin: 13.5 g/dL (ref 12.0–15.0)
MCH: 29.7 pg (ref 26.0–34.0)
MCHC: 34.3 g/dL (ref 30.0–36.0)
MCV: 86.6 fL (ref 78.0–100.0)
PLATELETS: 281 10*3/uL (ref 150–400)
RBC: 4.55 MIL/uL (ref 3.87–5.11)
RDW: 13.8 % (ref 11.5–15.5)
WBC: 10.5 10*3/uL (ref 4.0–10.5)

## 2015-05-03 LAB — BASIC METABOLIC PANEL
Anion gap: 9 (ref 5–15)
BUN: 5 mg/dL — AB (ref 6–20)
CHLORIDE: 108 mmol/L (ref 101–111)
CO2: 22 mmol/L (ref 22–32)
CREATININE: 0.75 mg/dL (ref 0.44–1.00)
Calcium: 9.4 mg/dL (ref 8.9–10.3)
GFR calc non Af Amer: >60 mL/min (ref >60)
Glucose, Bld: 114 mg/dL — ABNORMAL HIGH (ref 65–99)
Potassium: 3.3 mmol/L — ABNORMAL LOW (ref 3.5–5.1)
SODIUM: 139 mmol/L (ref 135–145)

## 2015-05-03 LAB — I-STAT TROPONIN, ED
TROPONIN I, POC: 0.01 ng/mL (ref 0.00–0.08)
TROPONIN I, POC: 0 ng/mL (ref 0.00–0.08)

## 2015-05-03 MED ORDER — DIPHENHYDRAMINE HCL 50 MG/ML IJ SOLN
25.0000 mg | Freq: Once | INTRAMUSCULAR | Status: AC
  Administered 2015-05-03: 25 mg via INTRAVENOUS
  Filled 2015-05-03: qty 1

## 2015-05-03 MED ORDER — IOHEXOL 350 MG/ML SOLN
60.0000 mL | Freq: Once | INTRAVENOUS | Status: AC | PRN
  Administered 2015-05-03: 60 mL via INTRAVENOUS

## 2015-05-03 MED ORDER — ONDANSETRON HCL 4 MG/2ML IJ SOLN
4.0000 mg | Freq: Once | INTRAMUSCULAR | Status: AC
  Administered 2015-05-03: 4 mg via INTRAVENOUS
  Filled 2015-05-03: qty 2

## 2015-05-03 MED ORDER — IOHEXOL 300 MG/ML  SOLN: 100.0000 mL | Freq: Once | INTRAMUSCULAR | Status: DC | PRN

## 2015-05-03 NOTE — ED Notes (Signed)
Pt. reports central chest pain with SOB and nausea onset this evening , pt. received 1 NTG sl and 4 baby ASA prior to arrival .

## 2015-05-03 NOTE — ED Provider Notes (Signed)
CSN: [id number]     Arrival date & time 05/03/15  0041 History   First MD Initiated Contact with Patient 05/03/15 [phone]     Chief Complaint  Patient presents with  . Chest Pain     (Consider location/radiation/quality/duration/timing/severity/associated sxs/prior Treatment) HPI Comments: Patient presents to the ER for evaluation of chest pain. Patient reports that she had onset of central chest pain, shortness of breath, nausea earlier this evening. Patient does not have any known coronary artery disease. She does have a history, however, fibromyalgia and thinks it might be related. Patient reports that the pain came on suddenly. She reports a dull aching pain with a sharp stabbing component. She has never had these symptoms before.  Patient is a 35 y.o. female presenting with chest pain.  Chest Pain Associated symptoms: shortness of breath     Past Medical History  Diagnosis Date  . 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 Kidney stones   . Asthma   . Carpal tunnel syndrome of right wrist   . Headache(784.0)   . GERD (gastroesophageal reflux disease)   . Post partum depression   . Depression   . Arthritis   . Obesity   . Fibromyalgia    Past Surgical History  Procedure Laterality Date  . Cesarean section    . Lithotripsy      stint and stone removal ( ureter kinked)  . Cesarean section  01/09/2011    Procedure: CESAREAN SECTION;  Surgeon: Esmeralda Arthur, MD;  Location: [facility];  Service: Gynecology;  Laterality: N/A;   Family History  Problem Relation Age of Onset  . Cancer Mother   . Depression Mother   . Hypertension Mother   . Hypertension Father   . Depression Maternal Grandmother   . Hypertension Maternal Grandmother    Social History  Substance Use Topics  . Smoking status: Never Smoker   . Smokeless  tobacco: Never Used  . Alcohol Use: No   OB History    Gravida Para Term Preterm AB TAB SAB Ectopic Multiple Living   Review of Systems  Respiratory: Positive for shortness of breath.   Cardiovascular: Positive for chest pain.  All other systems reviewed and are negative.     Allergies  Oxycodone; Gabapentin; Abilify; Augmentin; Hydrocodone-acetaminophen; and Imitrex  Home Medications   Prior to Admission medications   Medication Sig Start Date End Date Taking? Authorizing Provider  BETA CAROTENE PO Take 1 tablet by mouth daily.   Yes Historical Provider, MD  buprenorphine (BUTRANS) 10 MCG/HR PTWK patch Place 10 mcg onto the skin once a week.  03/31/15 06/29/15 Yes Historical Provider, MD  DULoxetine (CYMBALTA) 30 MG capsule Take 90 mg by mouth daily. 04/05/15  Yes Historical Provider, MD  ibuprofen (ADVIL,MOTRIN) 800 MG tablet Take 800 mg by mouth daily.   Yes Historical Provider, MD  levonorgestrel (MIRENA, 52 MG,) 20 MCG/24HR IUD 1 each by Intrauterine route once.    Yes Historical Provider, MD  Multiple Vitamin (MULTIVITAMIN WITH MINERALS) TABS tablet Take 1 tablet by mouth daily.   Yes Historical Provider, MD  ondansetron (ZOFRAN) 4 MG tablet Take 4 mg by mouth every 8 (eight) hours as needed for nausea.    Yes Historical Provider, MD  oxyCODONE (ROXICODONE) 5 MG immediate  release tablet Take 1 tablet (5 mg total) by mouth every 4 (four) hours as needed for severe pain. 04/11/14  Yes Courtney Forcucci, PA-C  tiZANidine (ZANAFLEX) 4 MG tablet Take 8 mg by mouth at bedtime. 02/09/15  Yes Historical Provider, MD  diphenhydrAMINE (BENADRYL) 25 MG tablet Take 1 tablet (25 mg total) by mouth every 6 (six) hours. Patient not taking: Reported on 05/03/2015 06/04/12   Vanetta Mulders, MD  famotidine (PEPCID) 20 MG tablet Take 1 tablet (20 mg total) by mouth 2 (two) times daily. Patient not taking: Reported on 05/03/2015 06/04/12   Vanetta Mulders, MD   HYDROcodone-acetaminophen (NORCO/VICODIN) 5-325 MG per tablet Take 1-2 tablets by mouth every 6 (six) hours as needed for pain. Patient not taking: Reported on 05/03/2015 06/04/12   Vanetta Mulders, MD  ketorolac (TORADOL) 10 MG tablet Take 1 tablet (10 mg total) by mouth every 6 (six) hours as needed (Take with food. Do not take more than 4 per day. Do not take for longer than 5 days). Patient not taking: Reported on 05/03/2015 10/04/13   Joni Reining Pisciotta, PA-C  metroNIDAZOLE (FLAGYL) 500 MG tablet Take 1 tablet (500 mg total) by mouth 2 (two) times daily. One po bid x 7 days Patient not taking: Reported on 05/03/2015 10/04/13   Joni Reining Pisciotta, PA-C   BP 167/93 mmHg  Pulse 75  Temp(Src) 98.2 F (36.8 C) (Oral)  Resp 16  Ht 5\' 3"  (1.6 m)  Wt 232 lb (105.235 kg)  BMI 41.11 kg/m2  SpO2 100% Physical Exam  Constitutional: She is oriented to person, place, and time. She appears well-developed and well-nourished. No distress.  HENT:  Head: Normocephalic and atraumatic.  Right Ear: Hearing normal.  Left Ear: Hearing normal.  Nose: Nose normal.  Mouth/Throat: Oropharynx is clear and moist and mucous membranes are normal.  Eyes: Conjunctivae and EOM are normal. Pupils are equal, round, and reactive to light.  Neck: Normal range of motion. Neck supple.  Cardiovascular: Regular rhythm, S1 normal and S2 normal.  Exam reveals no gallop and no friction rub.   No murmur heard. Pulmonary/Chest: Effort normal and breath sounds normal. No respiratory distress. She exhibits no tenderness.  Abdominal: Soft. Normal appearance and bowel sounds are normal. There is no hepatosplenomegaly. There is no tenderness. There is no rebound, no guarding, no tenderness at McBurney's point and negative Murphy's sign. No hernia.  Musculoskeletal: Normal range of motion.  Neurological: She is alert and oriented to person, place, and time. She has normal strength. No cranial nerve deficit or sensory deficit. Coordination  normal. GCS eye subscore is 4. GCS verbal subscore is 5. GCS motor subscore is 6.  Skin: Skin is warm, dry and intact. No rash noted. No cyanosis.  Psychiatric: She has a normal mood and affect. Her speech is normal and behavior is normal. Thought content normal.  Nursing note and vitals reviewed.   ED Course  Procedures (including critical care time) Labs Review Labs Reviewed  BASIC METABOLIC PANEL - Abnormal; Notable for the following:    Potassium 3.3 (*)    Glucose, Bld 114 (*)    BUN 5 (*)    All other components within normal limits  CBC  I-STAT TROPOININ, ED  Rosezena Sensor, ED    Imaging Review Dg Chest 2 View  05/03/2015  CLINICAL DATA:  Lower central chest pain tonight. EXAM: CHEST  2 VIEW COMPARISON:  03/14/2012 FINDINGS: The heart size and mediastinal contours are within normal limits. Both lungs are clear. The  visualized skeletal structures are unremarkable. IMPRESSION: No active cardiopulmonary disease. Electronically Signed   By: Burman Nieves M.D.   On: 05/03/2015 01:24   Ct Angio Chest Pe W/cm &/or Wo Cm  05/03/2015  CLINICAL DATA:  Central chest pain with shortness of breath and nausea starting this evening. EXAM: CT ANGIOGRAPHY CHEST WITH CONTRAST TECHNIQUE: Multidetector CT imaging of the chest was performed using the standard protocol during bolus administration of intravenous contrast. Multiplanar CT image reconstructions and MIPs were obtained to evaluate the vascular anatomy. CONTRAST:  60mL OMNIPAQUE IOHEXOL 350 MG/ML SOLN COMPARISON:  None. FINDINGS: Technically adequate study with good opacification of the central and segmental pulmonary arteries. No focal filling defects. No evidence of significant pulmonary embolus. Normal heart size. Normal caliber thoracic aorta. Esophagus is decompressed. No significant lymphadenopathy in the chest. Evaluation of lungs is limited due to respiratory motion. No focal consolidation suggested. Mild dependent atelectasis in  the bases. No pleural effusions. No pneumothorax. Airways appear patent. Included portions of the upper abdominal organs demonstrate a small stone in the upper pole left kidney. Degenerative changes in the spine. No destructive bone lesions. Review of the MIP images confirms the above findings. IMPRESSION: No evidence of significant pulmonary embolus. No evidence of active pulmonary disease. Electronically Signed   By: Burman Nieves M.D.   On: 05/03/2015 06:28   I have personally reviewed and evaluated these images and lab results as part of my medical decision-making.   EKG Interpretation   Date/Time:  Tuesday May 03 2015 01:04:49 EST Ventricular Rate:  84 PR Interval:  148 QRS Duration: 72 QT Interval:  360 QTC Calculation: 425 R Axis:   26 Text Interpretation:  Normal sinus rhythm with sinus arrhythmia Normal ECG  Confirmed by POLLINA  MD, CHRISTOPHER ([id number]) on 05/03/2015 7:03:50 AM      MDM   Final diagnoses:  None   chest pain  Presents to the ER for evaluation of chest pain. Patient reports that symptoms began earlier today. She has sharp stabbing pains in the center of her chest that radiated into her back. No abdominal pain. She had nausea but no vomiting. Abdominal exam was benign, no tenderness. Chest wall was not tender. EKG was normal. Troponin negative. Second troponin negative as well. CT angiography performed to rule out aortic dissection and PE. CT was normal. Patient reassured, symptoms mechanical and musculoskeletal in nature most likely. Will be discharged to continue her analgesia regimen and follow-up with primary doctor.    Gilda Crease, MD 05/03/15 [phone]

## 2015-05-03 NOTE — Discharge Instructions (Signed)
Nonspecific Chest Pain  °Chest pain can be caused by many different conditions. There is always a chance that your pain could be related to something serious, such as a heart attack or a blood clot in your lungs. Chest pain can also be caused by conditions that are not life-threatening. If you have chest pain, it is very important to follow up with your health care provider. °CAUSES  °Chest pain can be caused by: °· Heartburn. °· Pneumonia or bronchitis. °· Anxiety or stress. °· Inflammation around your heart (pericarditis) or lung (pleuritis or pleurisy). °· A blood clot in your lung. °· A collapsed lung (pneumothorax). It can develop suddenly on its own (spontaneous pneumothorax) or from trauma to the chest. °· Shingles infection (varicella-zoster virus). °· Heart attack. °· Damage to the bones, muscles, and cartilage that make up your chest wall. This can include: °¨ Bruised bones due to injury. °¨ Strained muscles or cartilage due to frequent or repeated coughing or overwork. °¨ Fracture to one or more ribs. °¨ Sore cartilage due to inflammation (costochondritis). °RISK FACTORS  °Risk factors for chest pain may include: °· Activities that increase your risk for trauma or injury to your chest. °· Respiratory infections or conditions that cause frequent coughing. °· Medical conditions or overeating that can cause heartburn. °· Heart disease or family history of heart disease. °· Conditions or health behaviors that increase your risk of developing a blood clot. °· Having had chicken pox (varicella zoster). °SIGNS AND SYMPTOMS °Chest pain can feel like: °· Burning or tingling on the surface of your chest or deep in your chest. °· Crushing, pressure, aching, or squeezing pain. °· Dull or sharp pain that is worse when you move, cough, or take a deep breath. °· Pain that is also felt in your back, neck, shoulder, or arm, or pain that spreads to any of these areas. °Your chest pain may come and go, or it may stay  constant. °DIAGNOSIS °Lab tests or other studies may be needed to find the cause of your pain. Your health care provider may have you take a test called an ambulatory ECG (electrocardiogram). An ECG records your heartbeat patterns at the time the test is performed. You may also have other tests, such as: °· Transthoracic echocardiogram (TTE). During echocardiography, sound waves are used to create a picture of all of the heart structures and to look at how blood flows through your heart. °· Transesophageal echocardiogram (TEE). This is a more advanced imaging test that obtains images from inside your body. It allows your health care provider to see your heart in finer detail. °· Cardiac monitoring. This allows your health care provider to monitor your heart rate and rhythm in real time. °· Holter monitor. This is a portable device that records your heartbeat and can help to diagnose abnormal heartbeats. It allows your health care provider to track your heart activity for several days, if needed. °· Stress tests. These can be done through exercise or by taking medicine that makes your heart beat more quickly. °· Blood tests. °· Imaging tests. °TREATMENT  °Your treatment depends on what is causing your chest pain. Treatment may include: °· Medicines. These may include: °¨ Acid blockers for heartburn. °¨ Anti-inflammatory medicine. °¨ Pain medicine for inflammatory conditions. °¨ Antibiotic medicine, if an infection is present. °¨ Medicines to dissolve blood clots. °¨ Medicines to treat coronary artery disease. °· Supportive care for conditions that do not require medicines. This may include: °¨ Resting. °¨ Applying heat   or cold packs to injured areas. °¨ Limiting activities until pain decreases. °HOME CARE INSTRUCTIONS °· If you were prescribed an antibiotic medicine, finish it all even if you start to feel better. °· Avoid any activities that bring on chest pain. °· Do not use any tobacco products, including  cigarettes, chewing tobacco, or electronic cigarettes. If you need help quitting, ask your health care provider. °· Do not drink alcohol. °· Take medicines only as directed by your health care provider. °· Keep all follow-up visits as directed by your health care provider. This is important. This includes any further testing if your chest pain does not go away. °· If heartburn is the cause for your chest pain, you may be told to keep your head raised (elevated) while sleeping. This reduces the chance that acid will go from your stomach into your esophagus. °· Make lifestyle changes as directed by your health care provider. These may include: °¨ Getting regular exercise. Ask your health care provider to suggest some activities that are safe for you. °¨ Eating a heart-healthy diet. A registered dietitian can help you to learn healthy eating options. °¨ Maintaining a healthy weight. °¨ Managing diabetes, if necessary. °¨ Reducing stress. °SEEK MEDICAL CARE IF: °· Your chest pain does not go away after treatment. °· You have a rash with blisters on your chest. °· You have a fever. °SEEK IMMEDIATE MEDICAL CARE IF:  °· Your chest pain is worse. °· You have an increasing cough, or you cough up blood. °· You have severe abdominal pain. °· You have severe weakness. °· You faint. °· You have chills. °· You have sudden, unexplained chest discomfort. °· You have sudden, unexplained discomfort in your arms, back, neck, or jaw. °· You have shortness of breath at any time. °· You suddenly start to sweat, or your skin gets clammy. °· You feel nauseous or you vomit. °· You suddenly feel light-headed or dizzy. °· Your heart begins to beat quickly, or it feels like it is skipping beats. °These symptoms may represent a serious problem that is an emergency. Do not wait to see if the symptoms will go away. Get medical help right away. Call your local emergency services (911 in the [place] Do not drive yourself to the hospital. °  °This  information is not intended to replace advice given to you by your health care provider. Make sure you discuss any questions you have with your health care provider. °  °Document Released: 12/13/2004 Document Revised: 03/26/2014 Document Reviewed: 10/09/2013 °Elsevier Interactive Patient Education ©2016 [employer] ° °

## 2020-06-20 ENCOUNTER — Emergency Department (HOSPITAL_BASED_OUTPATIENT_CLINIC_OR_DEPARTMENT_OTHER)
Admission: EM | Admit: 2020-06-20 | Discharge: 2020-06-20 | Disposition: A | Discharge status: Home / Self Care | Payer: BLUE CROSS/BLUE SHIELD | Attending: Emergency Medicine | Admitting: Emergency Medicine

## 2020-06-20 ENCOUNTER — Other Ambulatory Visit: Payer: Self-pay

## 2020-06-20 DIAGNOSIS — R001 Bradycardia, unspecified: Secondary | ICD-10-CM | POA: Diagnosis not present

## 2020-06-20 DIAGNOSIS — Z20822 Contact with and (suspected) exposure to covid-19: Secondary | ICD-10-CM | POA: Diagnosis not present

## 2020-06-20 DIAGNOSIS — R590 Localized enlarged lymph nodes: Secondary | ICD-10-CM | POA: Insufficient documentation

## 2020-06-20 DIAGNOSIS — J45909 Unspecified asthma, uncomplicated: Secondary | ICD-10-CM | POA: Diagnosis not present

## 2020-06-20 DIAGNOSIS — R5383 Other fatigue: Secondary | ICD-10-CM | POA: Diagnosis not present

## 2020-06-20 LAB — CBC WITH DIFFERENTIAL/PLATELET
Abs Immature Granulocytes: 0.02 10*3/uL (ref 0.00–0.07)
Basophils Absolute: 0 10*3/uL (ref 0.0–0.1)
Basophils Relative: 1 %
Eosinophils Absolute: 0.3 10*3/uL (ref 0.0–0.5)
Eosinophils Relative: 5 %
HCT: 43.6 % (ref 36.0–46.0)
Hemoglobin: 15.2 g/dL — ABNORMAL HIGH (ref 12.0–15.0)
Immature Granulocytes: 0 %
Lymphocytes Relative: 33 %
Lymphs Abs: 2.4 10*3/uL (ref 0.7–4.0)
MCH: 30.8 pg (ref 26.0–34.0)
MCHC: 34.9 g/dL (ref 30.0–36.0)
MCV: 88.4 fL (ref 80.0–100.0)
Monocytes Absolute: 0.5 10*3/uL (ref 0.1–1.0)
Monocytes Relative: 8 %
Neutro Abs: 3.8 10*3/uL (ref 1.7–7.7)
Neutrophils Relative %: 53 %
Platelets: 266 10*3/uL (ref 150–400)
RBC: 4.93 MIL/uL (ref 3.87–5.11)
RDW: 13.2 % (ref 11.5–15.5)
WBC: 7.1 10*3/uL (ref 4.0–10.5)
nRBC: 0 % (ref 0.0–0.2)

## 2020-06-20 LAB — BASIC METABOLIC PANEL
Anion gap: 9 (ref 5–15)
BUN: 8 mg/dL (ref 6–20)
CO2: 23 mmol/L (ref 22–32)
Calcium: 9.3 mg/dL (ref 8.9–10.3)
Chloride: 105 mmol/L (ref 98–111)
Creatinine, Ser: 0.8 mg/dL (ref 0.44–1.00)
GFR, Estimated: >60 mL/min (ref >60)
Glucose, Bld: 102 mg/dL — ABNORMAL HIGH (ref 70–99)
Potassium: 3.6 mmol/L (ref 3.5–5.1)
Sodium: 137 mmol/L (ref 135–145)

## 2020-06-20 LAB — URINALYSIS, ROUTINE W REFLEX MICROSCOPIC
Glucose, UA: NEGATIVE mg/dL
Hgb urine dipstick: NEGATIVE
Ketones, ur: NEGATIVE mg/dL
Leukocytes,Ua: NEGATIVE
Nitrite: NEGATIVE
Protein, ur: NEGATIVE mg/dL
Specific Gravity, Urine: >1.03 — ABNORMAL HIGH (ref 1.005–1.030)
pH: 6 (ref 5.0–8.0)

## 2020-06-20 LAB — TROPONIN I (HIGH SENSITIVITY): Troponin I (High Sensitivity): <2 ng/L (ref <18)

## 2020-06-20 LAB — RESP PANEL BY RT-PCR (FLU A&B, COVID) ARPGX2
Influenza A by PCR: NEGATIVE
Influenza B by PCR: NEGATIVE
SARS Coronavirus 2 by RT PCR: NEGATIVE

## 2020-06-20 LAB — TSH: TSH: 2.483 u[IU]/mL (ref 0.350–4.500)

## 2020-06-20 LAB — MAGNESIUM: Magnesium: 2.2 mg/dL (ref 1.7–2.4)

## 2020-06-20 NOTE — ED Notes (Signed)
Pt via wheel chair to room 01. Vitals taken. A&Ox4. Placed in gown. Connected to cardiac monitor, BP and pulse ox. Stretcher low, wheels locked, call bell within reach. Family at bedside.

## 2020-06-20 NOTE — ED Notes (Signed)
Report from Ashley, RN

## 2020-06-20 NOTE — ED Triage Notes (Signed)
Pt via POV reports dizziness, light headed and swallowing issues since Wednesday. Pt also reports noticing on apple watch HR were in the 40's around 0500 this morning. Pt also reports epigastric pain. Was seen at PCP Thursday for further work up and has not heard anything back. Patient able to speak full sentences. Respirations regular/unlabored.

## 2020-06-20 NOTE — Discharge Instructions (Signed)
Your work-up in the ER today was reassuring.  However I discussed importance of very close follow-up with cardiology.  I placed a referral to their office, and they should contact you within 1-2 business days.  If you do not hear from them in that time, please call their office to ask for the next available appointment.  You should refrain from driving in that period of time.  If you ever begin to feel lightheaded or have a slow heart rate, please try to sit or lay down.  If your heart rate is below 40 beats per minute, and you feel light headed or woozy, please call 911 and do not hesitate to return to the ER.  Please call your doctors office to follow-up on your thyroid test.

## 2020-06-20 NOTE — ED Provider Notes (Signed)
[facility] EMERGENCY DEPARTMENT Provider Note   CSN: [id number] Arrival date & time: 06/20/20  0600     History Chief Complaint  Patient presents with  . Dizziness  . Abdominal Pain    Laura Tucker is a 40 y.o. female for history of fibromyalgia, presenting to the emergency department with concern for bradycardia and fatigue.  The patient reports her symptoms began 5 days ago on Wednesday.  She states she began to feel very lightheaded and dizzy, and her Apple Watch notified her that her heart rate was low.  She reports her heart rate was 30-40 bpm at the time.  She says she has intermittently had episodes throughout the week where she feels lightheaded.  She feels her heart rate is low at each episode.  This never happened her before.  She describes also feeling of soreness in her bilateral jaw and under her neck.  She says she saw her PCP a few days ago and had thyroid studies done but these have not come back.  She denies any history of smoking, high cholesterol, hypertension.  She does report a significant history of MI in her father under age 41.  She is here with her husband today.  She denies fevers or chills.  She denies nausea, vomiting, diarrhea.  We were able to review the data from her apple watch extending back 30 days.  This shows near daily episodes of bradycardia with heart rate generally in the 40s to 50s.  She has had a few isolated episodes of her heart rate as low as 31.  HPI     Past Medical History:  Diagnosis Date  . Arthritis   . Asthma   . Carpal tunnel syndrome of right wrist   . Depression   . Fibromyalgia   . GERD (gastroesophageal reflux disease)   . Headache(784.0)   . 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 Kidney stones   . Obesity   . Post partum depression     Patient Active  Problem List   Diagnosis Date Noted  . Post partum depression   . Depression   . History of intrauterine fetal death, currently pregnant 01/09/2011  . Obesity, morbid (HCC) 01/09/2011  . 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 CESAREAN SECTION    . CESAREAN SECTION  01/09/2011   Procedure: CESAREAN SECTION;  Surgeon: Esmeralda ArthurSandra A Rivard, MD;  Location: [facility];  Service: Gynecology;  Laterality: N/A;  . LITHOTRIPSY     stint and stone removal ( ureter kinked)     OB History    Gravida  3   Para  3   Term  1   Preterm  2   AB      Living  2     SAB      IAB      Ectopic      Multiple      Live Births  2           Family History  Problem Relation Age of Onset  . Cancer Mother   . Depression Mother   . Hypertension Mother   . Hypertension Father   . Depression Maternal Grandmother   . Hypertension Maternal Grandmother     Social History   Tobacco Use  . Smoking  status: Never Smoker  . Smokeless tobacco: Never Used  Substance Use Topics  . Alcohol use: No  . Drug use: No    Home Medications Prior to Admission medications   Medication Sig Start Date End Date Taking? Authorizing Provider  BETA CAROTENE PO Take 1 tablet by mouth daily.    [provider]  diphenhydrAMINE (BENADRYL) 25 MG tablet Take 1 tablet (25 mg total) by mouth every 6 (six) hours. Patient not taking: Reported on 05/03/2015 06/04/12   Vanetta Mulders, MD  DULoxetine (CYMBALTA) 30 MG capsule Take 90 mg by mouth daily. 04/05/15   [provider]  famotidine (PEPCID) 20 MG tablet Take 1 tablet (20 mg total) by mouth 2 (two) times daily. Patient not taking: Reported on 05/03/2015 06/04/12   Vanetta Mulders, MD  HYDROcodone-acetaminophen (NORCO/VICODIN) 5-325 MG per tablet Take 1-2 tablets by mouth every 6 (six) hours as needed for pain. Patient not taking: Reported on 05/03/2015 06/04/12   Vanetta Mulders, MD   ibuprofen (ADVIL,MOTRIN) 800 MG tablet Take 800 mg by mouth daily.    [provider]  ketorolac (TORADOL) 10 MG tablet Take 1 tablet (10 mg total) by mouth every 6 (six) hours as needed (Take with food. Do not take more than 4 per day. Do not take for longer than 5 days). Patient not taking: Reported on 05/03/2015 10/04/13   Pisciotta, Joni Reining, PA-C  levonorgestrel (MIRENA, 52 MG,) 20 MCG/24HR IUD 1 each by Intrauterine route once.     [provider]  metroNIDAZOLE (FLAGYL) 500 MG tablet Take 1 tablet (500 mg total) by mouth 2 (two) times daily. One po bid x 7 days Patient not taking: Reported on 05/03/2015 10/04/13   Pisciotta, Joni Reining, PA-C  Multiple Vitamin (MULTIVITAMIN WITH MINERALS) TABS tablet Take 1 tablet by mouth daily.    [provider]  ondansetron (ZOFRAN) 4 MG tablet Take 4 mg by mouth every 8 (eight) hours as needed for nausea.     [provider]  oxyCODONE (ROXICODONE) 5 MG immediate release tablet Take 1 tablet (5 mg total) by mouth every 4 (four) hours as needed for severe pain. 04/11/14   Shirleen Schirmer, PA-C  tiZANidine (ZANAFLEX) 4 MG tablet Take 8 mg by mouth at bedtime. 02/09/15   [provider]    Allergies    Oxycodone, Gabapentin, Abilify [aripiprazole], Augmentin [amoxicillin-pot clavulanate], Hydrocodone-acetaminophen, and Imitrex [sumatriptan base]  Review of Systems   Review of Systems  Constitutional: Negative for chills and fever.  Eyes: Negative for pain and visual disturbance.  Respiratory: Positive for shortness of breath. Negative for cough.   Cardiovascular: Positive for palpitations. Negative for chest pain.  Gastrointestinal: Negative for abdominal pain and vomiting.  Musculoskeletal: Negative for arthralgias, back pain and myalgias.  Skin: Negative for color change and rash.  Neurological: Positive for light-headedness. Negative for syncope.  Psychiatric/Behavioral: Negative for agitation and confusion.   All other systems reviewed and are negative.   Physical Exam Updated Vital Signs BP (!) 144/93 (BP Location: Left Arm)   Pulse 83   Temp 98.5 F (36.9 C) (Oral)   Resp 20   Ht 5\' 3"  (1.6 m)   Wt 106.6 kg   SpO2 100%   BMI 41.63 kg/m   Physical Exam Constitutional:      General: She is not in acute distress.    Appearance: She is obese.  HENT:     Head: Normocephalic and atraumatic.     Comments: Submandibular lymphadenopathy Eyes:  Conjunctiva/sclera: Conjunctivae normal.     Pupils: Pupils are equal, round, and reactive to light.  Cardiovascular:     Rate and Rhythm: Normal rate and regular rhythm.  Pulmonary:     Effort: Pulmonary effort is normal. No respiratory distress.  Abdominal:     General: There is no distension.     Tenderness: There is no abdominal tenderness.  Skin:    General: Skin is warm and dry.  Neurological:     General: No focal deficit present.     Mental Status: She is alert. Mental status is at baseline.  Psychiatric:        Mood and Affect: Mood normal.        Behavior: Behavior normal.     ED Results / Procedures / Treatments   Labs (all labs ordered are listed, but only abnormal results are displayed) Labs Reviewed  BASIC METABOLIC PANEL - Abnormal; Notable for the following components:      Result Value   Glucose, Bld 102 (*)    All other components within normal limits  CBC WITH DIFFERENTIAL/PLATELET - Abnormal; Notable for the following components:   Hemoglobin 15.2 (*)    All other components within normal limits  URINALYSIS, ROUTINE W REFLEX MICROSCOPIC - Abnormal; Notable for the following components:   APPearance HAZY (*)    Specific Gravity, Urine >1.030 (*)    Bilirubin Urine SMALL (*)    All other components within normal limits  RESP PANEL BY RT-PCR (FLU A&B, COVID) ARPGX2  MAGNESIUM  TSH  TROPONIN I (HIGH SENSITIVITY)    EKG None  Radiology No results found.  Procedures Procedures   Medications  Ordered in ED Medications - No data to display  ED Course  I have reviewed the triage vital signs and the nursing notes.  Pertinent labs & imaging results that were available during my care of the patient were reviewed by me and considered in my medical decision making (see chart for details).  This patient complains of bradycardia intermittently at home.  Apple watch data reviewed as noted above.  She's been having episodes of bradycardia for at least 1 month as long as data has been collected.  Mostly these are HR in 40's-50's, occasionally into the 30's.  ECG reviewed and tele reviewed while here showing NSR, no bradycardic event.   I ordered, reviewed, and interpreted labs.  No life-threatening abnormalities were noted on these tests.  Trop <2 .  WBC normal.  Perhaps some very mild hemoconcentration or dehydration on her labs, high spec gravity on UA.  Cr normal.  TSH sent off - PCP also evaluating TSH levels.  At this point I doubt infection, sepsis, or MI.  Note - following discharge it appears her ECG did not cross over into our system.  Paper copy had been reviewed.     Clinical Course as of 06/20/20 1740  Mon Jun 20, 2020  5053 She remains asymptomatic at this time.  Heart rate has been normal here.  I will discharge her with referral to cardiology.  She may benefit from a ZIO or cardiac monitor.  We discussed close return precautions, and advised that she refrain from driving until she sees a cardiologist.  She and her husband verbalized understanding. [MT]    Clinical Course User Index [MT] [place] Ponder, Kermit Balo, MD   Final Clinical Impression(s) / ED Diagnoses Final diagnoses:  Bradycardia    Rx / DC Orders ED Discharge Orders  Ordered    Ambulatory referral to Cardiology       Comments: Symptomatic episodes of bradycardia, seen in ED on 4/4, advised cardiology follow up for possible heart monitor   06/20/20 0957           Terald Sleeper, MD 06/20/20  1740

## 2020-06-21 DIAGNOSIS — R001 Bradycardia, unspecified: Secondary | ICD-10-CM | POA: Insufficient documentation

## 2020-06-21 NOTE — Progress Notes (Signed)
Cardiology Office Note   Date:  06/22/2020   ID:  Laura Tucker, DOB [birth year], MRN [id number]  PCP:  Laura Chessman, MD  Cardiologist:   No primary care provider on file. Referring:  ED  Chief Complaint  Patient presents with  . Bradycardia      History of Present Illness: JAKERIA Tucker is a 40 y.o. female who presents for evaluation of presyncope and bradycardia.  She was in the ED two days ago with bradycardia. I reviewed these records for this visit.    Her Galaxy watch does show bradycardia.    Labs including TSH were unremarkable.  I looked at her watch recording it is not an EKG but it does have heart rates that ranged from the 30s to the 110s.  One of the episodes of 31 was at 1 PM when she was awake.  Other times it happens when she might be asleep.  She gets a week.  She has not had frank syncope.  She might have some right neck discomfort with this.  This can also happen without any bradycardic rhythms.  This is sporadic.  It is almost daily.  Sharp and lasts for minutes.  This is a new discomfort she is never had before.  She cannot bring this on.  She does her household chores though she is limited at times by fibromyalgia.  She is not describing substernal discomfort or arm discomfort.  She does get short of breath some days with activities which she is not describing PND or orthopnea.  She has never had any prior cardiac work-up.  She has sleep apnea and does not tolerate current CPAP.  Past Medical History:  Diagnosis Date  . Arthritis   . Asthma   . Carpal tunnel syndrome of right wrist   . Depression   . Fibromyalgia   . GERD (gastroesophageal reflux disease)   . Headache(784.0)   . 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 Kidney stones   . Obesity   . Post partum depression   . Sleep apnea    CPAP  Does wear it    Past Surgical History:  Procedure Laterality Date  . CESAREAN SECTION    . CESAREAN SECTION  01/09/2011   Procedure: CESAREAN SECTION;  Surgeon: Esmeralda Arthur, MD;  Location: [facility];  Service: Gynecology;  Laterality: N/A;  . LITHOTRIPSY     stint and stone removal ( ureter kinked)     Current Outpatient Medications  Medication Sig Dispense Refill  . DULoxetine (CYMBALTA) 30 MG capsule Take 90 mg by mouth daily.  0  . ibuprofen (ADVIL,MOTRIN) 800 MG tablet Take 800 mg by mouth daily.    Marland Kitchen levonorgestrel (MIRENA) 20 MCG/24HR IUD 1 each by Intrauterine route once.     . Multiple Vitamin (MULTIVITAMIN WITH MINERALS) TABS tablet Take 1 tablet by mouth daily.    . ondansetron (ZOFRAN) 4 MG tablet Take 4 mg by mouth every 8 (eight) hours as needed for nausea.     Marland Kitchen tiZANidine (ZANAFLEX) 4 MG tablet Take 8 mg by mouth at bedtime.    . BELBUCA 300 MCG FILM SMARTSIG:1 Strip(s) By Mouth Every 12 Hours    . Cetirizine HCl (ZYRTEC ALLERGY) 10 MG CAPS Zyrtec 10 mg capsule  Take by oral route.    . Cholecalciferol (VITAMIN D3) 1.25  MG (50000 UT) CAPS Vitamin D3     No current facility-administered medications for this visit.    Allergies:   Oxycodone, Gabapentin, Abilify [aripiprazole], Augmentin [amoxicillin-pot clavulanate], Hydrocodone-acetaminophen, and Imitrex [sumatriptan base]    Social History:  The patient  reports that she has never smoked. She has never used smokeless tobacco. She reports that she does not drink alcohol and does not use drugs.   Family History:  The patient's family history includes CAD (age of onset: 59) in her father; Cancer in her mother; Depression in her maternal grandmother and mother; Hypertension in her father, maternal grandmother, and mother.    ROS:  Please see the history of present illness.   Otherwise, review of systems are positive for constipation.   All other systems are reviewed and negative.    PHYSICAL EXAM: VS:  BP 132/86    Pulse 85   Ht 5\' 3"  (1.6 m)   Wt 250 lb 3.2 oz (113.5 kg)   SpO2 99%   BMI 44.32 kg/m  , BMI Body mass index is 44.32 kg/m. GENERAL:  Well appearing HEENT:  Pupils equal round and reactive, fundi not visualized, oral mucosa unremarkable NECK:  No jugular venous distention, waveform within normal limits, carotid upstroke brisk and symmetric, no bruits, no thyromegaly LYMPHATICS:  No cervical, inguinal adenopathy LUNGS:  Clear to auscultation bilaterally BACK:  No CVA tenderness CHEST:  Unremarkable HEART:  PMI not displaced or sustained,S1 and S2 within normal limits, no S3, no S4, no clicks, no rubs, no murmurs ABD:  Flat, positive bowel sounds normal in frequency in pitch, no bruits, no rebound, no guarding, no midline pulsatile mass, no hepatomegaly, no splenomegaly EXT:  2 plus pulses throughout, no edema, no cyanosis no clubbing SKIN:  No rashes no nodules NEURO:  Cranial nerves II through XII grossly intact, motor grossly intact throughout PSYCH:  Cognitively intact, oriented to person place and time    EKG:  EKG is ordered today. The ekg ordered today demonstrates sinus rhythm, rate 85, axis within normal limits, intervals within normal limits, no acute ST-T wave changes.   Recent Labs: 06/20/2020: BUN 8; Creatinine, Ser 0.80; Hemoglobin 15.2; Magnesium 2.2; Platelets 266; Potassium 3.6; Sodium 137; TSH 2.483    Lipid Panel No results found for: CHOL, TRIG, HDL, CHOLHDL, VLDL, LDLCALC, LDLDIRECT    Wt Readings from Last 3 Encounters:  06/22/20 250 lb 3.2 oz (113.5 kg)  06/20/20 235 lb (106.6 kg)  05/03/15 232 lb (105.2 kg)      Other studies Reviewed: Additional studies/ records that were reviewed today include: ED records. Review of the above records demonstrates:  Please see elsewhere in the note.     ASSESSMENT AND PLAN:  BRADYCARDIA: Unfortunately could not see an EKG or rhythm strips from the emergency room.  She will wear a 2-week event monitor.  CHEST  PAIN: She had some jaw discomfort and a little bit down into her upper chest.  She has a strong family history of early coronary disease.  I think the pretest probability of obstructive coronary disease is low but I am going to order a POET (Plain Old Exercise Treadmill)  SLEEP APNEA: She has diagnosed sleep apnea but does not tolerate the current mask and is not being followed for this.  I will establish a new sleep study physician in our practice.    Current medicines are reviewed at length with the patient today.  The patient does not have concerns regarding medicines.  The following  changes have been made:  no change  Labs/ tests ordered today include:   Orders Placed This Encounter  Procedures  . Ambulatory referral to Cardiology  . Exercise Tolerance Test  . LONG TERM MONITOR (3-14 DAYS)  . EKG 12-Lead     Disposition:   FU with me after the above testing   Signed, Rollene Rotunda, MD  06/22/2020 12:30 PM    [facility]

## 2020-06-22 ENCOUNTER — Ambulatory Visit (INDEPENDENT_AMBULATORY_CARE_PROVIDER_SITE_OTHER): Payer: BC Managed Care – PPO

## 2020-06-22 ENCOUNTER — Encounter: Payer: Self-pay | Admitting: Cardiology

## 2020-06-22 ENCOUNTER — Ambulatory Visit (INDEPENDENT_AMBULATORY_CARE_PROVIDER_SITE_OTHER): Payer: BC Managed Care – PPO | Admitting: Cardiology

## 2020-06-22 ENCOUNTER — Encounter: Payer: Self-pay | Admitting: *Deleted

## 2020-06-22 ENCOUNTER — Other Ambulatory Visit: Payer: Self-pay

## 2020-06-22 VITALS — BP 132/86 | HR 85 | Ht 63.0 in | Wt 250.2 lb

## 2020-06-22 DIAGNOSIS — G4730 Sleep apnea, unspecified: Secondary | ICD-10-CM | POA: Diagnosis not present

## 2020-06-22 DIAGNOSIS — R001 Bradycardia, unspecified: Secondary | ICD-10-CM

## 2020-06-22 DIAGNOSIS — R079 Chest pain, unspecified: Secondary | ICD-10-CM | POA: Diagnosis not present

## 2020-06-22 DIAGNOSIS — R0602 Shortness of breath: Secondary | ICD-10-CM

## 2020-06-22 NOTE — Progress Notes (Signed)
Patient ID: Laura Tucker, female   DOB: 1980/12/05, 40 y.o.   MRN: [id number] Patient enrolled for [employer] to ship a 14 day ZIO XT monitor to her home.

## 2020-06-22 NOTE — Patient Instructions (Signed)
Medication Instructions:  No changes *If you need a refill on your cardiac medications before your next appointment, please call your pharmacy*   Lab Work: None ordered If you have labs (blood work) drawn today and your tests are completely normal, you will receive your results only by: Marland Kitchen MyChart Message (if you have MyChart) OR . A paper copy in the mail If you have any lab test that is abnormal or we need to change your treatment, we will call you to review the results.   Testing/Procedures: Your physician has requested that you have an exercise tolerance test. For further information please visit [email or web address] Please also follow instruction sheet, as given. This will take place at [address] [facility], [address].  Do not drink or eat foods with caffeine for 24 hours before the test. (Chocolate, coffee, tea, or energy drinks)  If you use an inhaler, bring it with you to the test.  Do not smoke for 4 hours before the test.  Wear comfortable shoes and clothing.  You will need a covid test prior  ZIO XT- Long Term Monitor Instructions   Your physician has requested you wear your ZIO patch monitor____14___days.   This is a single patch monitor.  [employer] supplies one patch monitor per enrollment.  Additional stickers are not available.   Please do not apply patch if you will be having a Nuclear Stress Test, Echocardiogram, Cardiac CT, MRI, or Chest Xray during the time frame you would be wearing the monitor. The patch cannot be worn during these tests.  You cannot remove and re-apply the ZIO XT patch monitor.   Your ZIO patch monitor will be sent USPS Priority mail from [facility] directly to your home address. The monitor may also be mailed to a PO BOX if home delivery is not available.   It may take 3-5 days to receive your monitor after you have been enrolled.   Once you have received you monitor, please review enclosed instructions.  Your monitor has already been  registered assigning a specific monitor serial # to you.   Applying the monitor   Shave hair from upper left chest.   Hold abrader disc by orange tab.  Rub abrader in 40 strokes over left upper chest as indicated in your monitor instructions.   Clean area with 4 enclosed alcohol pads .  Use all pads to assure are is cleaned thoroughly.  Let dry.   Apply patch as indicated in monitor instructions.  Patch will be place under collarbone on left side of chest with arrow pointing upward.   Rub patch adhesive wings for 2 minutes.Remove white label marked "1".  Remove white label marked "2".  Rub patch adhesive wings for 2 additional minutes.   While looking in a mirror, press and release button in center of patch.  A small green light will flash 3-4 times .  This will be your only indicator the monitor has been turned on.     Do not shower for the first 24 hours.  You may shower after the first 24 hours.   Press button if you feel a symptom. You will hear a small click.  Record Date, Time and Symptom in the Patient Log Book.   When you are ready to remove patch, follow instructions on last 2 pages of Patient Log Book.  Stick patch monitor onto last page of Patient Log Book.   Place Patient Log Book in [place] box.  Use locking tab on box and  tape box closed securely.  The [employer] has [employer] on it.  Please place in mailbox as soon as possible.  Your physician should have your test results approximately 7 days after the monitor has been mailed back to [facility].   Call [facility] Customer Care at [phone] if you have questions regarding your ZIO XT patch monitor.  Call them immediately if you see an orange light blinking on your monitor.   If your monitor falls off in less than 4 days contact our Monitor department at [phone].  If your monitor becomes loose or falls off after 4 days call [employer] at [phone] for suggestions on securing your monitor.      Follow-Up: At [facility], you and your health needs are our priority.  As part of our continuing mission to provide you with exceptional heart care, we have created designated Provider Care Teams.  These Care Teams include your primary Cardiologist (physician) and Advanced Practice Providers (APPs -  Physician Assistants and Nurse Practitioners) who all work together to provide you with the care you need, when you need it.  We recommend signing up for the patient portal called "MyChart".  Sign up information is provided on this After Visit Summary.  MyChart is used to connect with patients for Virtual Visits (Telemedicine).  Patients are able to view lab/test results, encounter notes, upcoming appointments, etc.  Non-urgent messages can be sent to your provider as well.   To learn more about what you can do with MyChart, go to [email or web address].    Your next appointment:   Follow up with APP after the monitor   Other Instructions A referral has been placed for Dr. Tresa Endo in his sleep clinic

## 2020-06-23 ENCOUNTER — Telehealth (HOSPITAL_COMMUNITY): Payer: Self-pay

## 2020-06-23 NOTE — Addendum Note (Signed)
Addended by: Barrie Dunker on: 06/23/2020 09:24 AM   Modules accepted: Orders

## 2020-06-23 NOTE — Telephone Encounter (Signed)
Close encounter 

## 2020-06-24 ENCOUNTER — Telehealth: Payer: Self-pay | Admitting: Cardiology

## 2020-06-24 ENCOUNTER — Other Ambulatory Visit (HOSPITAL_COMMUNITY)
Admission: RE | Admit: 2020-06-24 | Discharge: 2020-06-24 | Disposition: A | Discharge status: Home / Self Care | Payer: BLUE CROSS/BLUE SHIELD | Source: Ambulatory Visit | Attending: Cardiology | Admitting: Cardiology

## 2020-06-24 DIAGNOSIS — Z01812 Encounter for preprocedural laboratory examination: Secondary | ICD-10-CM | POA: Insufficient documentation

## 2020-06-24 DIAGNOSIS — Z20822 Contact with and (suspected) exposure to covid-19: Secondary | ICD-10-CM | POA: Diagnosis not present

## 2020-06-24 NOTE — Telephone Encounter (Signed)
We would recommend the patient wait to apply her monitor after her Nuclear stress test on Tuesday, 06/28/20.   The patch could possibly interfere with her Nuclear images.  The patient may also have significant perspiration during exercise, which may cause the ZIO XT patient to fall off prematurely. Explained to patient she will not be able to shower for 24 hours after the application of the monitor, so she may want to shower after her stress test before applying.  Do not use any lotions or oils on the upper left chest.

## 2020-06-24 NOTE — Telephone Encounter (Signed)
Will refer monitor question to our Monitor Tech, to further assist the pt.

## 2020-06-24 NOTE — Telephone Encounter (Signed)
Patient would like instruction as to when she should put on her heart monitor. She has a Stress Test scheduled for Tuesday 06/28/20. Please call her Husband's cell phone number  [phone].

## 2020-06-25 LAB — SARS CORONAVIRUS 2 (TAT 6-24 HRS): SARS Coronavirus 2: NEGATIVE

## 2020-06-28 ENCOUNTER — Other Ambulatory Visit: Payer: Self-pay

## 2020-06-28 ENCOUNTER — Telehealth: Payer: Self-pay | Admitting: *Deleted

## 2020-06-28 ENCOUNTER — Ambulatory Visit (HOSPITAL_COMMUNITY)
Admission: RE | Admit: 2020-06-28 | Discharge: 2020-06-28 | Disposition: A | Discharge status: Home / Self Care | Payer: BLUE CROSS/BLUE SHIELD | Source: Ambulatory Visit | Attending: Cardiovascular Disease | Admitting: Cardiovascular Disease

## 2020-06-28 DIAGNOSIS — R079 Chest pain, unspecified: Secondary | ICD-10-CM | POA: Diagnosis present

## 2020-06-28 DIAGNOSIS — R0789 Other chest pain: Secondary | ICD-10-CM

## 2020-06-28 LAB — EXERCISE TOLERANCE TEST
Estimated workload: 6.4 METS
Exercise duration (min): 4 min
Exercise duration (sec): 35 s
MPHR: 180 {beats}/min
Peak HR: 184 {beats}/min
Percent HR: 102 %
Rest HR: 104 {beats}/min

## 2020-06-28 NOTE — Telephone Encounter (Signed)
ETT consent order placed.   DOD (Dr. Tresa Endo) to discuss and sign

## 2020-06-28 NOTE — Telephone Encounter (Signed)
I have reviewed the routine treadmill test protocol procedure in detail with the patient.  She is aware of the risk benefits of the procedure and diagnostic assessment that the procedure will provide.  She agrees to undergo the treadmill test as initially recommended by Dr. Antoine Poche. Mena Pauls, MD, [facility]

## 2020-06-29 DIAGNOSIS — R001 Bradycardia, unspecified: Secondary | ICD-10-CM

## 2020-06-30 NOTE — Addendum Note (Signed)
Addended by: Rollene Rotunda on: 06/30/2020 06:25 PM   Modules accepted: Orders

## 2020-07-04 ENCOUNTER — Encounter: Payer: Self-pay | Admitting: Cardiology

## 2020-07-05 ENCOUNTER — Telehealth: Payer: Self-pay | Admitting: Cardiovascular Disease

## 2020-07-05 NOTE — Telephone Encounter (Signed)
Left message to call back  

## 2020-07-05 NOTE — Telephone Encounter (Signed)
Pt called in and stated she was returning [facility] call abourt results .  She would like to know if the results could be sent to her via mychart because her phone is messed up

## 2020-07-06 ENCOUNTER — Encounter: Payer: Self-pay | Admitting: *Deleted

## 2020-07-06 NOTE — Telephone Encounter (Signed)
Message sent to patient via my chart to call the office to discuss the results and further testing.

## 2020-07-08 ENCOUNTER — Telehealth: Payer: Self-pay | Admitting: Cardiology

## 2020-07-08 ENCOUNTER — Other Ambulatory Visit: Payer: Self-pay

## 2020-07-08 DIAGNOSIS — Z01812 Encounter for preprocedural laboratory examination: Secondary | ICD-10-CM

## 2020-07-08 DIAGNOSIS — R0602 Shortness of breath: Secondary | ICD-10-CM

## 2020-07-08 DIAGNOSIS — R079 Chest pain, unspecified: Secondary | ICD-10-CM

## 2020-07-08 DIAGNOSIS — R072 Precordial pain: Secondary | ICD-10-CM

## 2020-07-08 MED ORDER — METOPROLOL TARTRATE 100 MG PO TABS: ORAL_TABLET | ORAL | 0 refills | Status: DC

## 2020-07-08 NOTE — Telephone Encounter (Signed)
Pt is returning your call in regards to scheduling more tests, Please advise.   Laura Tucker, there were EKG changes on the treadmill when you exercised. Because of this, Dr Antoine Poche would like for you to have more testing done. Please give [place] a call when you cn so we can discuss.

## 2020-07-08 NOTE — Telephone Encounter (Signed)
Spoke to patient treadmill results given.Dr.Hochrein advised schedule coronary ct.Orders placed.Advised will be scheduled after approved by insurance.Advised will need a bmet done 1 week before.Advised instructions will be mailed.Advised to call back if she has any questions with instructions.  Your cardiac CT will be scheduled at one of the below locations:   [facility] [address] [place], [place] [address] [phone]  OR  [facility] [address] [place], [place] [address] [phone]  If scheduled at [facility], please arrive at the [facility] main entrance (entrance A) of [facility] 30 minutes prior to test start time. Proceed to the [facility] Radiology Department (first floor) to check-in and test prep.  If scheduled at [facility], please arrive 15 mins early for check-in and test prep.  Please follow these instructions carefully (unless otherwise directed):    On the Night Before the Test: . Be sure to Drink plenty of water. . Do not consume any caffeinated/decaffeinated beverages or chocolate 12 hours prior to your test. . Do not take any antihistamines 12 hours prior to your test.   On the Day of the Test: . Drink plenty of water until 1 hour prior to the test. . Do not eat any food 4 hours prior to the test. . You may take your regular medications prior to the test.  . Take metoprolol 100 mg two hours prior to test. . FEMALES- please wear underwire-free bra if available          After the Test: . Drink plenty of water. . After receiving IV contrast, you may experience a mild flushed feeling. This is normal. . On occasion, you may experience a mild rash up to 24 hours after the test. This is not dangerous. If this occurs, you can take Benadryl 25 mg and increase your fluid intake. . If you experience trouble breathing, this can be serious. If it is  severe call 911 IMMEDIATELY. If it is mild, please call our office.    Once we have confirmed authorization from your insurance company, we will call you to set up a date and time for your test. Based on how quickly your insurance processes prior authorizations requests, please allow up to 4 weeks to be contacted for scheduling your Cardiac CT appointment. Be advised that routine Cardiac CT appointments could be scheduled as many as 8 weeks after your provider has ordered it.  For non-scheduling related questions, please contact the cardiac imaging nurse navigator should you have any questions/concerns: Rockwell Alexandria, Cardiac Imaging Nurse Navigator Larey Brick, Cardiac Imaging Nurse Navigator [facility] Direct Office Dial: [phone]   For scheduling needs, including cancellations and rescheduling, please call [place], [phone].

## 2020-07-08 NOTE — Telephone Encounter (Signed)
See previous 07/08/20 telephone note.

## 2020-07-15 MED ORDER — METOPROLOL TARTRATE 25 MG PO TABS: ORAL_TABLET | ORAL | 0 refills | Status: DC

## 2020-07-15 NOTE — Telephone Encounter (Signed)
Attempted to contact pt to inform Dr. Antoine Poche recommended only 25 mg of Metoprolol 2 hours prior to test. Unable to reach pt but contacted pharmacy and was able to verify pt has not picked up 100 mg. Pharmacy will cancel order and send new Rx for 25 mg.   Rollene Rotunda, MD  Parke Poisson, RN How about 25 mg tartrate metoprolol the morning of a few hours before only. I don't have her monitor back yet and there are reported HRs in the 30s on her wearable.

## 2020-07-15 NOTE — Addendum Note (Signed)
Addended by: Parke Poisson on: 07/15/2020 02:49 PM   Modules accepted: Orders

## 2020-07-22 LAB — BASIC METABOLIC PANEL
BUN/Creatinine Ratio: 9 (ref 9–23)
BUN: 7 mg/dL (ref 6–24)
CO2: 25 mmol/L (ref 20–29)
Calcium: 9.7 mg/dL (ref 8.7–10.2)
Chloride: 100 mmol/L (ref 96–106)
Creatinine, Ser: 0.78 mg/dL (ref 0.57–1.00)
Glucose: 65 mg/dL (ref 65–99)
Potassium: 3.7 mmol/L (ref 3.5–5.2)
Sodium: 139 mmol/L (ref 134–144)
eGFR: 98 mL/min/{1.73_m2} (ref >59)

## 2020-07-26 ENCOUNTER — Telehealth (HOSPITAL_COMMUNITY): Payer: Self-pay | Admitting: Emergency Medicine

## 2020-07-26 NOTE — Telephone Encounter (Signed)
Unable to leave VM Verona Hartshorn RN Navigator Cardiac Imaging [facility] [phone] Office  [phone] Cell  

## 2020-07-28 ENCOUNTER — Ambulatory Visit (HOSPITAL_COMMUNITY)
Admission: RE | Admit: 2020-07-28 | Discharge: 2020-07-28 | Disposition: A | Discharge status: Home / Self Care | Payer: BC Managed Care – PPO | Source: Ambulatory Visit | Attending: Cardiology | Admitting: Cardiology

## 2020-07-28 ENCOUNTER — Other Ambulatory Visit: Payer: Self-pay

## 2020-07-28 DIAGNOSIS — R072 Precordial pain: Secondary | ICD-10-CM

## 2020-07-28 DIAGNOSIS — R079 Chest pain, unspecified: Secondary | ICD-10-CM | POA: Insufficient documentation

## 2020-07-28 DIAGNOSIS — R0602 Shortness of breath: Secondary | ICD-10-CM | POA: Insufficient documentation

## 2020-07-28 DIAGNOSIS — I2510 Atherosclerotic heart disease of native coronary artery without angina pectoris: Secondary | ICD-10-CM | POA: Diagnosis not present

## 2020-07-28 DIAGNOSIS — R931 Abnormal findings on diagnostic imaging of heart and coronary circulation: Secondary | ICD-10-CM | POA: Diagnosis not present

## 2020-07-28 MED ORDER — METOPROLOL TARTRATE 5 MG/5ML IV SOLN
INTRAVENOUS | Status: AC
  Filled 2020-07-28: qty 5

## 2020-07-28 MED ORDER — IOHEXOL 350 MG/ML SOLN
95.0000 mL | Freq: Once | INTRAVENOUS | Status: AC | PRN
  Administered 2020-07-28: 95 mL via INTRAVENOUS

## 2020-07-28 MED ORDER — NITROGLYCERIN 0.4 MG SL SUBL: 0.8000 mg | SUBLINGUAL_TABLET | Freq: Once | SUBLINGUAL | Status: AC

## 2020-07-28 MED ORDER — METOPROLOL TARTRATE 5 MG/5ML IV SOLN
INTRAVENOUS | Status: AC
  Administered 2020-07-28: 5 mg
  Filled 2020-07-28: qty 5

## 2020-07-28 MED ORDER — NITROGLYCERIN 0.4 MG SL SUBL
SUBLINGUAL_TABLET | SUBLINGUAL | Status: AC
  Administered 2020-07-28: 0.8 mg via SUBLINGUAL
  Filled 2020-07-28: qty 2

## 2020-08-11 ENCOUNTER — Ambulatory Visit: Payer: BC Managed Care – PPO | Admitting: Cardiology

## 2020-08-22 DIAGNOSIS — R072 Precordial pain: Secondary | ICD-10-CM | POA: Insufficient documentation

## 2020-08-22 NOTE — H&P (View-Only) (Signed)
Cardiology Office Note   Date:  08/23/2020   ID:  Laura Tucker, DOB [birth year], MRN [id number]  PCP:  [facility]  Cardiologist:   None Referring:  ED  Chief Complaint  Patient presents with  . Chest Pain      History of Present Illness: Laura Tucker is a 40 y.o. female who presented for evaluation of presyncope and bradycardia.   I ordered a two week monitor.  She had no significant arrhythmias.  She had a CT with distal non obstruct RCA stenosis.     Unfortunately she continues to get chest discomfort.  This is a discomfort that is in her mid chest and to the right side and up into her neck.  She describes a "elephant on her chest."  She describes with exertion or at rest.  She thinks it might be getting slightly worse.  She does have palpitations as described previously.  Her watch tells her sometimes her heart rate is low.  However, she not had any presyncope or syncope.  Past Medical History:  Diagnosis Date  . Arthritis   . Asthma   . Carpal tunnel syndrome of right wrist   . Depression   . Fibromyalgia   . GERD (gastroesophageal reflux disease)   . Headache(784.0)   . 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 Kidney stones   . Obesity   . Post partum depression   . Sleep apnea    CPAP Does wear it    Past Surgical History:  Procedure Laterality Date  . CESAREAN SECTION    . CESAREAN SECTION  01/09/2011   Procedure: CESAREAN SECTION;  Surgeon: Esmeralda Arthur, MD;  Location: [facility];  Service: Gynecology;  Laterality: N/A;  . LITHOTRIPSY     stint and stone removal ( ureter kinked)     Current Outpatient Medications  Medication Sig Dispense Refill  . aspirin EC 81 MG tablet Take 81 mg by mouth daily. Swallow whole.    Marland Kitchen BELBUCA 300 MCG FILM Place 300 mcg inside cheek in the morning and at  bedtime.    . DULoxetine (CYMBALTA) 30 MG capsule Take 90 mg by mouth at bedtime.  0  . ibuprofen (ADVIL,MOTRIN) 800 MG tablet Take 800 mg by mouth every 8 (eight) hours as needed (pain).    Marland Kitchen levonorgestrel (MIRENA) 20 MCG/24HR IUD 1 each by Intrauterine route once.     . Multiple Vitamin (MULTIVITAMIN WITH MINERALS) TABS tablet Take 1 tablet by mouth at bedtime.    . ondansetron (ZOFRAN) 4 MG tablet Take 4 mg by mouth every 8 (eight) hours as needed for nausea.     Marland Kitchen tiZANidine (ZANAFLEX) 4 MG tablet Take 8 mg by mouth at bedtime.    . cetirizine (ZYRTEC) 10 MG tablet Take 10 mg by mouth daily as needed (seasonal allergies).    . Cholecalciferol (VITAMIN D3) 125 MCG (5000 UT) TABS Take 5,000 Units by mouth at bedtime.    Marland Kitchen rOPINIRole (REQUIP) 0.25 MG tablet Take 0.25 mg by mouth at bedtime.     No current facility-administered medications for this visit.    Allergies:   Oxycodone, Pregabalin, Gabapentin, Topiramate, Abilify [aripiprazole], Augmentin [amoxicillin-pot clavulanate], Hydrocodone-acetaminophen, and Imitrex [sumatriptan base]    ROS:  Please see the history of present illness.   Otherwise, review of systems are positive for  none.   All other systems are reviewed and negative.    PHYSICAL EXAM: VS:  BP 124/84   Pulse 97   Ht 5\' 3"  (1.6 m)   Wt 252 lb (114.3 kg)   SpO2 98%   BMI 44.64 kg/m  , BMI Body mass index is 44.64 kg/m. GENERAL:  Well appearing NECK:  No jugular venous distention, waveform within normal limits, carotid upstroke brisk and symmetric, no bruits, no thyromegaly LUNGS:  Clear to auscultation bilaterally CHEST:  Unremarkable HEART:  PMI not displaced or sustained,S1 and S2 within normal limits, no S3, no S4, no clicks, no rubs, no murmurs ABD:  Flat, positive bowel sounds normal in frequency in pitch, no bruits, no rebound, no guarding, no midline pulsatile mass, no hepatomegaly, no splenomegaly EXT:  2 plus pulses throughout, no edema, no cyanosis no  clubbing   EKG:  EKG is not ordered today.    Recent Labs: 06/20/2020: Hemoglobin 15.2; Magnesium 2.2; Platelets 266; TSH 2.483 07/22/2020: BUN 7; Creatinine, Ser 0.78; Potassium 3.7; Sodium 139    Lipid Panel No results found for: CHOL, TRIG, HDL, CHOLHDL, VLDL, LDLCALC, LDLDIRECT    Wt Readings from Last 3 Encounters:  08/23/20 252 lb (114.3 kg)  06/22/20 250 lb 3.2 oz (113.5 kg)  06/20/20 235 lb (106.6 kg)      Other studies Reviewed: Additional studies/ records that were reviewed today include: ED records. Review of the above records demonstrates:  Please see elsewhere in the note.     ASSESSMENT AND PLAN:  BRADYCARDIA:    We had a long discussion about this.  The bradycardia might be under counted ectopic beats.  She had these as noted on her monitor.  In addition she has had some SVT as well.  However, she is not particularly bothered by these enough to warrant treatment unless she has increasing symptoms.  She is going to figure out her wearable to see if she can capture future symptoms.  Given the fact that there were no sustainable arrhythmias no further work-up at this point unless she has increasing symptoms.   CAD:   She had distal RCA stenosis.  She will be managed medically.  However, she has continued unstable symptoms that need to be further evaluated with a cardiac cath.  The patient understands that risks included but are not limited to stroke (1 in 1000), death (1 in 1000), kidney failure [usually temporary] (1 in 500), bleeding (1 in 200), allergic reaction [possibly serious] (1 in 200).  The patient understands and agrees to proceed.   She will be started on an aspirin  RISK REDUCTION: I am going to have her come back for fasting lipid profile and would also like an LP(a).  I will start her on a statin based on these results.  She will come back in the morning for her blood work to include the precath including a beta-hCG and the lipid profile.  SLEEP APNEA:   I  have made referral to our sleep clinic.    Current medicines are reviewed at length with the patient today.  The patient does not have concerns regarding medicines.  The following changes have been made:    Labs/ tests ordered today include:    Orders Placed This Encounter  Procedures  . CBC with Differential/Platelet  . Basic metabolic panel     Disposition:   FU with me after the cath.     Signed, 08/20/20, MD  08/23/2020 6:16 PM  [facility]

## 2020-08-22 NOTE — Progress Notes (Signed)
Cardiology Office Note   Date:  08/23/2020   ID:  Laura Tucker, DOB [birth year], MRN [id number]  PCP:  [facility]  Cardiologist:   None Referring:  ED  Chief Complaint  Patient presents with  . Chest Pain      History of Present Illness: Laura Tucker is a 40 y.o. female who presented for evaluation of presyncope and bradycardia.   I ordered a two week monitor.  She had no significant arrhythmias.  She had a CT with distal non obstruct RCA stenosis.     Unfortunately she continues to get chest discomfort.  This is a discomfort that is in her mid chest and to the right side and up into her neck.  She describes a "elephant on her chest."  She describes with exertion or at rest.  She thinks it might be getting slightly worse.  She does have palpitations as described previously.  Her watch tells her sometimes her heart rate is low.  However, she not had any presyncope or syncope.  Past Medical History:  Diagnosis Date  . Arthritis   . Asthma   . Carpal tunnel syndrome of right wrist   . Depression   . Fibromyalgia   . GERD (gastroesophageal reflux disease)   . Headache(784.0)   . 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 Kidney stones   . Obesity   . Post partum depression   . Sleep apnea    CPAP Does wear it    Past Surgical History:  Procedure Laterality Date  . CESAREAN SECTION    . CESAREAN SECTION  01/09/2011   Procedure: CESAREAN SECTION;  Surgeon: Esmeralda Arthur, MD;  Location: [facility];  Service: Gynecology;  Laterality: N/A;  . LITHOTRIPSY     stint and stone removal ( ureter kinked)     Current Outpatient Medications  Medication Sig Dispense Refill  . aspirin EC 81 MG tablet Take 81 mg by mouth daily. Swallow whole.    Marland Kitchen BELBUCA 300 MCG FILM Place 300 mcg inside cheek in the morning and at  bedtime.    . DULoxetine (CYMBALTA) 30 MG capsule Take 90 mg by mouth at bedtime.  0  . ibuprofen (ADVIL,MOTRIN) 800 MG tablet Take 800 mg by mouth every 8 (eight) hours as needed (pain).    Marland Kitchen levonorgestrel (MIRENA) 20 MCG/24HR IUD 1 each by Intrauterine route once.     . Multiple Vitamin (MULTIVITAMIN WITH MINERALS) TABS tablet Take 1 tablet by mouth at bedtime.    . ondansetron (ZOFRAN) 4 MG tablet Take 4 mg by mouth every 8 (eight) hours as needed for nausea.     Marland Kitchen tiZANidine (ZANAFLEX) 4 MG tablet Take 8 mg by mouth at bedtime.    . cetirizine (ZYRTEC) 10 MG tablet Take 10 mg by mouth daily as needed (seasonal allergies).    . Cholecalciferol (VITAMIN D3) 125 MCG (5000 UT) TABS Take 5,000 Units by mouth at bedtime.    Marland Kitchen rOPINIRole (REQUIP) 0.25 MG tablet Take 0.25 mg by mouth at bedtime.     No current facility-administered medications for this visit.    Allergies:   Oxycodone, Pregabalin, Gabapentin, Topiramate, Abilify [aripiprazole], Augmentin [amoxicillin-pot clavulanate], Hydrocodone-acetaminophen, and Imitrex [sumatriptan base]    ROS:  Please see the history of present illness.   Otherwise, review of systems are positive for  none.   All other systems are reviewed and negative.    PHYSICAL EXAM: VS:  BP 124/84   Pulse 97   Ht 5' 3" (1.6 m)   Wt 252 lb (114.3 kg)   SpO2 98%   BMI 44.64 kg/m  , BMI Body mass index is 44.64 kg/m. GENERAL:  Well appearing NECK:  No jugular venous distention, waveform within normal limits, carotid upstroke brisk and symmetric, no bruits, no thyromegaly LUNGS:  Clear to auscultation bilaterally CHEST:  Unremarkable HEART:  PMI not displaced or sustained,S1 and S2 within normal limits, no S3, no S4, no clicks, no rubs, no murmurs ABD:  Flat, positive bowel sounds normal in frequency in pitch, no bruits, no rebound, no guarding, no midline pulsatile mass, no hepatomegaly, no splenomegaly EXT:  2 plus pulses throughout, no edema, no cyanosis no  clubbing   EKG:  EKG is not ordered today.    Recent Labs: 06/20/2020: Hemoglobin 15.2; Magnesium 2.2; Platelets 266; TSH 2.483 07/22/2020: BUN 7; Creatinine, Ser 0.78; Potassium 3.7; Sodium 139    Lipid Panel No results found for: CHOL, TRIG, HDL, CHOLHDL, VLDL, LDLCALC, LDLDIRECT    Wt Readings from Last 3 Encounters:  08/23/20 252 lb (114.3 kg)  06/22/20 250 lb 3.2 oz (113.5 kg)  06/20/20 235 lb (106.6 kg)      Other studies Reviewed: Additional studies/ records that were reviewed today include: ED records. Review of the above records demonstrates:  Please see elsewhere in the note.     ASSESSMENT AND PLAN:  BRADYCARDIA:    We had a long discussion about this.  The bradycardia might be under counted ectopic beats.  She had these as noted on her monitor.  In addition she has had some SVT as well.  However, she is not particularly bothered by these enough to warrant treatment unless she has increasing symptoms.  She is going to figure out her wearable to see if she can capture future symptoms.  Given the fact that there were no sustainable arrhythmias no further work-up at this point unless she has increasing symptoms.   CAD:   She had distal RCA stenosis.  She will be managed medically.  However, she has continued unstable symptoms that need to be further evaluated with a cardiac cath.  The patient understands that risks included but are not limited to stroke (1 in 1000), death (1 in 1000), kidney failure [usually temporary] (1 in 500), bleeding (1 in 200), allergic reaction [possibly serious] (1 in 200).  The patient understands and agrees to proceed.   She will be started on an aspirin  RISK REDUCTION: I am going to have her come back for fasting lipid profile and would also like an LP(a).  I will start her on a statin based on these results.  She will come back in the morning for her blood work to include the precath including a beta-hCG and the lipid profile.  SLEEP APNEA:   I  have made referral to our sleep clinic.    Current medicines are reviewed at length with the patient today.  The patient does not have concerns regarding medicines.  The following changes have been made:    Labs/ tests ordered today include:    Orders Placed This Encounter  Procedures  . CBC with Differential/Platelet  . Basic metabolic panel     Disposition:   FU with me after the cath.     Signed, Shayley Medlin, MD  08/23/2020 6:16 PM      [facility]

## 2020-08-23 ENCOUNTER — Encounter: Payer: Self-pay | Admitting: Cardiology

## 2020-08-23 ENCOUNTER — Ambulatory Visit (INDEPENDENT_AMBULATORY_CARE_PROVIDER_SITE_OTHER): Payer: BC Managed Care – PPO | Admitting: Cardiology

## 2020-08-23 ENCOUNTER — Other Ambulatory Visit: Payer: Self-pay

## 2020-08-23 VITALS — BP 124/84 | HR 97 | Ht 63.0 in | Wt 252.0 lb

## 2020-08-23 DIAGNOSIS — Z01812 Encounter for preprocedural laboratory examination: Secondary | ICD-10-CM | POA: Diagnosis not present

## 2020-08-23 DIAGNOSIS — R001 Bradycardia, unspecified: Secondary | ICD-10-CM | POA: Diagnosis not present

## 2020-08-23 DIAGNOSIS — R072 Precordial pain: Secondary | ICD-10-CM

## 2020-08-23 NOTE — Patient Instructions (Signed)
Medication Instructions:   START ASPIRIN 81 MG DAILY   *If you need a refill on your cardiac medications before your next appointment, please call your pharmacy*  Lab Work: CBC/BMET TODAY   If you have labs (blood work) drawn today and your tests are completely normal, you will receive your results only by: Marland Kitchen MyChart Message (if you have MyChart) OR . A paper copy in the mail If you have any lab test that is abnormal or we need to change your treatment, we will call you to review the results.  Testing/Procedures: Your physician has requested that you have a cardiac catheterization. Cardiac catheterization is used to diagnose and/or treat various heart conditions. Doctors may recommend this procedure for a number of different reasons. The most common reason is to evaluate chest pain. Chest pain can be a symptom of coronary artery disease (CAD), and cardiac catheterization can show whether plaque is narrowing or blocking your heart's arteries. This procedure is also used to evaluate the valves, as well as measure the blood flow and oxygen levels in different parts of your heart. For further information please visit [email or web address] Please follow instruction sheet, as given.  Follow-Up: At [facility], you and your health needs are our priority.  As part of our continuing mission to provide you with exceptional heart care, we have created designated Provider Care Teams.  These Care Teams include your primary Cardiologist (physician) and Advanced Practice Providers (APPs -  Physician Assistants and Nurse Practitioners) who all work together to provide you with the care you need, when you need it.  We recommend signing up for the patient portal called "MyChart".  Sign up information is provided on this After Visit Summary.  MyChart is used to connect with patients for Virtual Visits (Telemedicine).  Patients are able to view lab/test results, encounter notes, upcoming appointments, etc.   Non-urgent messages can be sent to your provider as well.   To learn more about what you can do with MyChart, go to [email or web address].    Your next appointment:   4-5  week(s)  The format for your next appointment:   In Person  Provider:   You will see one of the following Advanced Practice Providers on your designated Care Team:    Theodore Demark, PA-C  Joni Reining, DNP, ANP  Other Instructions    [facility] CARDIOVASCULAR DIVISION [facility] [address] [place] [place] [address] Dept: [phone] Loc: [phone]  Laura Tucker  08/23/2020  You are scheduled for a Cardiac Catheterization on Monday, June 13 with Dr. Lance Muss.  1. Please arrive at the [facility] (Main Entrance A) at [facility]: [address] [place], [place] [address] at 5:30 AM (This time is two hours before your procedure to ensure your preparation). Free valet parking service is available.   Special note: Every effort is made to have your procedure done on time. Please understand that emergencies sometimes delay scheduled procedures.  2. Diet: Do not eat solid foods after midnight.  The patient may have clear liquids until 5am upon the day of the procedure.  3. Labs: You will need to have blood drawn TODAY  4. Medication instructions in preparation for your procedure:   Contrast Allergy: No   On the morning of your procedure, take your Aspirin and any morning medicines NOT listed above.  You may use sips of water.  5. Plan for one night stay--bring personal belongings. 6. Bring a  current list of your medications and current insurance cards. 7. You MUST have a responsible person to drive you home. 8. Someone MUST be with you the first 24 hours after you arrive home or your discharge will be delayed. 9. Please wear clothes that are easy to get on and off and wear slip-on shoes.  Thank you for allowing [place] to care for  you!   -- [facility]

## 2020-08-24 LAB — CBC WITH DIFFERENTIAL/PLATELET
Basophils Absolute: 0.1 10*3/uL (ref 0.0–0.2)
Basos: 1 %
EOS (ABSOLUTE): 0.4 10*3/uL (ref 0.0–0.4)
Eos: 4 %
Hematocrit: 44.9 % (ref 34.0–46.6)
Hemoglobin: 15.4 g/dL (ref 11.1–15.9)
Immature Grans (Abs): 0 10*3/uL (ref 0.0–0.1)
Immature Granulocytes: 0 %
Lymphocytes Absolute: 2.4 10*3/uL (ref 0.7–3.1)
Lymphs: 25 %
MCH: 31 pg (ref 26.6–33.0)
MCHC: 34.3 g/dL (ref 31.5–35.7)
MCV: 91 fL (ref 79–97)
Monocytes Absolute: 0.8 10*3/uL (ref 0.1–0.9)
Monocytes: 9 %
Neutrophils Absolute: 5.9 10*3/uL (ref 1.4–7.0)
Neutrophils: 61 %
Platelets: 301 10*3/uL (ref 150–450)
RBC: 4.96 x10E6/uL (ref 3.77–5.28)
RDW: 12.9 % (ref 11.7–15.4)
WBC: 9.7 10*3/uL (ref 3.4–10.8)

## 2020-08-24 LAB — BASIC METABOLIC PANEL
BUN/Creatinine Ratio: 10 (ref 9–23)
BUN: 7 mg/dL (ref 6–24)
CO2: 21 mmol/L (ref 20–29)
Calcium: 9.3 mg/dL (ref 8.7–10.2)
Chloride: 103 mmol/L (ref 96–106)
Creatinine, Ser: 0.7 mg/dL (ref 0.57–1.00)
Glucose: 95 mg/dL (ref 65–99)
Potassium: 4.1 mmol/L (ref 3.5–5.2)
Sodium: 139 mmol/L (ref 134–144)
eGFR: 112 mL/min/{1.73_m2} (ref >59)

## 2020-08-25 ENCOUNTER — Telehealth: Payer: Self-pay | Admitting: *Deleted

## 2020-08-25 NOTE — Telephone Encounter (Addendum)
Pt contacted pre-catheterization scheduled at [facility] for: Monday August 29, 2020 7:30 AM Verified arrival time and place: [facility] Main Entrance A [facility]) at: 5:30 AM   No solid food after midnight prior to cath, clear liquids until 5 AM day of procedure.  AM meds can be  taken pre-cath with sips of water including: ASA 81 mg   Confirmed patient has responsible adult to drive home post procedure and be with patient first 24 hours after arriving home: yes  You are allowed ONE visitor in the waiting room during the time you are at the hospital for your procedure. Both you and your visitor must wear a mask once you enter the hospital.   Patient reports does not currently have any symptoms concerning for COVID-19 and no household members with COVID-19 like illness.       Reviewed procedure/mask/visitor instructions with patient.

## 2020-08-29 ENCOUNTER — Encounter (HOSPITAL_COMMUNITY)
Admission: RE | Disposition: A | Discharge status: Home / Self Care | Payer: Self-pay | Source: Home / Self Care | Attending: Interventional Cardiology

## 2020-08-29 ENCOUNTER — Other Ambulatory Visit: Payer: Self-pay

## 2020-08-29 ENCOUNTER — Ambulatory Visit (HOSPITAL_COMMUNITY)
Admission: RE | Admit: 2020-08-29 | Discharge: 2020-08-29 | Disposition: A | Discharge status: Home / Self Care | Payer: BC Managed Care – PPO | Attending: Interventional Cardiology | Admitting: Interventional Cardiology

## 2020-08-29 ENCOUNTER — Encounter (HOSPITAL_COMMUNITY): Payer: Self-pay | Admitting: Interventional Cardiology

## 2020-08-29 DIAGNOSIS — Z793 Long term (current) use of hormonal contraceptives: Secondary | ICD-10-CM | POA: Diagnosis not present

## 2020-08-29 DIAGNOSIS — Z880 Allergy status to penicillin: Secondary | ICD-10-CM | POA: Diagnosis not present

## 2020-08-29 DIAGNOSIS — Z79899 Other long term (current) drug therapy: Secondary | ICD-10-CM | POA: Diagnosis not present

## 2020-08-29 DIAGNOSIS — Z881 Allergy status to other antibiotic agents status: Secondary | ICD-10-CM | POA: Diagnosis not present

## 2020-08-29 DIAGNOSIS — I25110 Atherosclerotic heart disease of native coronary artery with unstable angina pectoris: Secondary | ICD-10-CM | POA: Insufficient documentation

## 2020-08-29 DIAGNOSIS — Z885 Allergy status to narcotic agent status: Secondary | ICD-10-CM | POA: Diagnosis not present

## 2020-08-29 DIAGNOSIS — R072 Precordial pain: Secondary | ICD-10-CM

## 2020-08-29 DIAGNOSIS — G4730 Sleep apnea, unspecified: Secondary | ICD-10-CM | POA: Insufficient documentation

## 2020-08-29 DIAGNOSIS — Z888 Allergy status to other drugs, medicaments and biological substances status: Secondary | ICD-10-CM | POA: Insufficient documentation

## 2020-08-29 DIAGNOSIS — Z7982 Long term (current) use of aspirin: Secondary | ICD-10-CM | POA: Diagnosis not present

## 2020-08-29 DIAGNOSIS — R001 Bradycardia, unspecified: Secondary | ICD-10-CM | POA: Diagnosis not present

## 2020-08-29 HISTORY — PX: LEFT HEART CATH AND CORONARY ANGIOGRAPHY: CATH118249

## 2020-08-29 LAB — PREGNANCY, URINE: Preg Test, Ur: NEGATIVE

## 2020-08-29 SURGERY — LEFT HEART CATH AND CORONARY ANGIOGRAPHY
Anesthesia: LOCAL

## 2020-08-29 MED ORDER — ACETAMINOPHEN 325 MG PO TABS: 650.0000 mg | ORAL_TABLET | ORAL | Status: DC | PRN

## 2020-08-29 MED ORDER — VERAPAMIL HCL 2.5 MG/ML IV SOLN
INTRAVENOUS | Status: DC | PRN
  Administered 2020-08-29: 10 mL via INTRA_ARTERIAL

## 2020-08-29 MED ORDER — SODIUM CHLORIDE 0.9 % IV SOLN: INTRAVENOUS | Status: DC

## 2020-08-29 MED ORDER — HEPARIN (PORCINE) IN NACL 1000-0.9 UT/500ML-% IV SOLN
INTRAVENOUS | Status: DC | PRN
  Administered 2020-08-29 (×2): 500 mL

## 2020-08-29 MED ORDER — HEPARIN SODIUM (PORCINE) 1000 UNIT/ML IJ SOLN
INTRAMUSCULAR | Status: AC
  Filled 2020-08-29: qty 1

## 2020-08-29 MED ORDER — SODIUM CHLORIDE 0.9 % WEIGHT BASED INFUSION: 1.0000 mL/kg/h | INTRAVENOUS | Status: DC

## 2020-08-29 MED ORDER — FENTANYL CITRATE (PF) 100 MCG/2ML IJ SOLN
INTRAMUSCULAR | Status: AC
  Filled 2020-08-29: qty 2

## 2020-08-29 MED ORDER — SODIUM CHLORIDE 0.9 % IV SOLN: 250.0000 mL | INTRAVENOUS | Status: DC | PRN

## 2020-08-29 MED ORDER — SODIUM CHLORIDE 0.9% FLUSH: 3.0000 mL | INTRAVENOUS | Status: DC | PRN

## 2020-08-29 MED ORDER — SODIUM CHLORIDE 0.9% FLUSH: 3.0000 mL | Freq: Two times a day (BID) | INTRAVENOUS | Status: DC

## 2020-08-29 MED ORDER — HEPARIN (PORCINE) IN NACL 1000-0.9 UT/500ML-% IV SOLN
INTRAVENOUS | Status: AC
  Filled 2020-08-29: qty 1000

## 2020-08-29 MED ORDER — MIDAZOLAM HCL 2 MG/2ML IJ SOLN
INTRAMUSCULAR | Status: DC | PRN
  Administered 2020-08-29: 1 mg via INTRAVENOUS
  Administered 2020-08-29: 2 mg via INTRAVENOUS

## 2020-08-29 MED ORDER — HYDRALAZINE HCL 20 MG/ML IJ SOLN: 10.0000 mg | INTRAMUSCULAR | Status: DC | PRN

## 2020-08-29 MED ORDER — LIDOCAINE HCL (PF) 1 % IJ SOLN
INTRAMUSCULAR | Status: AC
  Filled 2020-08-29: qty 30

## 2020-08-29 MED ORDER — FENTANYL CITRATE (PF) 100 MCG/2ML IJ SOLN
INTRAMUSCULAR | Status: DC | PRN
  Administered 2020-08-29 (×2): 25 ug via INTRAVENOUS

## 2020-08-29 MED ORDER — ONDANSETRON HCL 4 MG/2ML IJ SOLN: 4.0000 mg | Freq: Four times a day (QID) | INTRAMUSCULAR | Status: DC | PRN

## 2020-08-29 MED ORDER — MIDAZOLAM HCL 2 MG/2ML IJ SOLN
INTRAMUSCULAR | Status: AC
  Filled 2020-08-29: qty 2

## 2020-08-29 MED ORDER — VERAPAMIL HCL 2.5 MG/ML IV SOLN
INTRAVENOUS | Status: AC
  Filled 2020-08-29: qty 2

## 2020-08-29 MED ORDER — LIDOCAINE HCL (PF) 1 % IJ SOLN
INTRAMUSCULAR | Status: DC | PRN
  Administered 2020-08-29: 2 mL

## 2020-08-29 MED ORDER — IOHEXOL 350 MG/ML SOLN
INTRAVENOUS | Status: DC | PRN
Start: 2020-08-29 — End: 2020-08-29
  Administered 2020-08-29: 20 mL

## 2020-08-29 MED ORDER — ASPIRIN 81 MG PO CHEW: 81.0000 mg | CHEWABLE_TABLET | ORAL | Status: DC

## 2020-08-29 MED ORDER — HEPARIN SODIUM (PORCINE) 1000 UNIT/ML IJ SOLN
INTRAMUSCULAR | Status: DC | PRN
  Administered 2020-08-29: 5500 [IU] via INTRAVENOUS

## 2020-08-29 MED ORDER — LABETALOL HCL 5 MG/ML IV SOLN: 10.0000 mg | INTRAVENOUS | Status: DC | PRN

## 2020-08-29 MED ORDER — SODIUM CHLORIDE 0.9 % WEIGHT BASED INFUSION
3.0000 mL/kg/h | INTRAVENOUS | Status: AC
  Administered 2020-08-29: 3 mL/kg/h via INTRAVENOUS

## 2020-08-29 SURGICAL SUPPLY — 11 items
CATH 5FR JL3.5 JR4 ANG PIG MP (CATHETERS) ×2 IMPLANT
DEVICE RAD COMP TR BAND LRG (VASCULAR PRODUCTS) ×2 IMPLANT
GLIDESHEATH SLEND SS 6F .021 (SHEATH) ×2 IMPLANT
GUIDEWIRE INQWIRE 1.5J.035X260 (WIRE) ×1 IMPLANT
INQWIRE 1.5J .035X260CM (WIRE) ×2
KIT HEART LEFT (KITS) ×2 IMPLANT
PACK CARDIAC CATHETERIZATION (CUSTOM PROCEDURE TRAY) ×2 IMPLANT
SHEATH PROBE COVER 6X72 (BAG) ×2 IMPLANT
TRANSDUCER W/STOPCOCK (MISCELLANEOUS) ×2 IMPLANT
TUBING ART PRESS 72  MALE/MALE (TUBING) ×2 IMPLANT
TUBING CIL FLEX 10 FLL-RA (TUBING) ×2 IMPLANT

## 2020-08-29 NOTE — Research (Signed)
IDENTIFY Informed Consent   Subject Name: Laura Tucker  Subject met inclusion and exclusion criteria.  The informed consent form, study requirements and expectations were reviewed with the subject and questions and concerns were addressed prior to the signing of the consent form.  The subject verbalized understanding of the trail requirements.  The subject agreed to participate in the IDENTIFY trial and signed the informed consent.  The informed consent was obtained prior to performance of any protocol-specific procedures for the subject.  A copy of the signed informed consent was given to the subject and a copy was placed in the subject's medical record.  Mena Goes. 08/29/2020, 0645 am

## 2020-08-29 NOTE — Interval H&P Note (Signed)
Cath Lab Visit (complete for each Cath Lab visit)  Clinical Evaluation Leading to the Procedure:   ACS: No.  Non-ACS:    Anginal Classification: CCS III  Anti-ischemic medical therapy: Minimal Therapy (1 class of medications)  Non-Invasive Test Results: Low-risk stress test findings: cardiac mortality <1%/year  Prior CABG: No previous CABG      History and Physical Interval Note:  08/29/2020 7:26 AM  Laura Tucker  has presented today for surgery, with the diagnosis of unstable angina.  The various methods of treatment have been discussed with the patient and family. After consideration of risks, benefits and other options for treatment, the patient has consented to  Procedure(s): LEFT HEART CATH AND CORONARY ANGIOGRAPHY (N/A) as a surgical intervention.  The patient's history has been reviewed, patient examined, no change in status, stable for surgery.  I have reviewed the patient's chart and labs.  Questions were answered to the patient's satisfaction.     Lance Muss

## 2020-08-29 NOTE — Discharge Instructions (Signed)

## 2020-09-28 ENCOUNTER — Ambulatory Visit (INDEPENDENT_AMBULATORY_CARE_PROVIDER_SITE_OTHER): Payer: BLUE CROSS/BLUE SHIELD | Admitting: Physician Assistant

## 2020-09-28 ENCOUNTER — Other Ambulatory Visit: Payer: Self-pay

## 2020-09-28 ENCOUNTER — Encounter: Payer: Self-pay | Admitting: Physician Assistant

## 2020-09-28 VITALS — BP 120/90 | HR 105 | Ht 63.0 in | Wt 247.2 lb

## 2020-09-28 DIAGNOSIS — E785 Hyperlipidemia, unspecified: Secondary | ICD-10-CM | POA: Diagnosis not present

## 2020-09-28 DIAGNOSIS — R000 Tachycardia, unspecified: Secondary | ICD-10-CM | POA: Diagnosis not present

## 2020-09-28 DIAGNOSIS — I2510 Atherosclerotic heart disease of native coronary artery without angina pectoris: Secondary | ICD-10-CM

## 2020-09-28 NOTE — Progress Notes (Signed)
Cardiology Office Note:    Date:  10/01/2020   ID:  Laura Tucker, DOB 08-07-1980, MRN [id number]  PCP:  [facility]   [facility] Providers Cardiologist:  Rollene Rotunda, MD {  Referring MD: [facility]   Chief Complaint  Patient presents with   Follow-up    Seen for Dr. Antoine Poche    History of Present Illness:    Laura Tucker is a 40 y.o. female with a hx of presyncope and bradycardia.  Previous heart monitor showing no significant arrhythmia.  Coronary CT obtained on 07/28/2020 showed 50 to 69% distal nonobstructive RCA disease.  Unfortunately she continued to have chest discomfort.  Due to persistent symptoms, she eventually was set up for outpatient cardiac catheterization on 08/29/2020 that showed 25% distal RCA lesion, otherwise normal coronary artery in the left main, LAD and left circumflex artery, EF 55 to 65%.  Medical therapy was recommended.  She was referred to sleep study.  Patient presents today for follow-up.  We discussed the recent cardiac catheterization results.  She still occasionally have a symptom of brief tachycardia, however this was not captured on the previous heart monitor.  Previous heart monitor only showed a brief SVT episode of 5 beats.  She has an apple watch, I asked her to continue to monitor on apple watch.  At this time, I would not recommend any further work-up.  She does require a fasting lipid panel and lipoprotein a.  If elevated, she will be placed on low-dose statin.  Otherwise she can follow-up with Dr. 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/06/10; previously positive during 2nd pregnancy; antibody titers during this gestation initially negative;  titer 1:4 on 10/06/10    Kidney stones    Obesity    Post partum depression    Sleep apnea    CPAP Does wear it    Past Surgical History:  Procedure Laterality Date   CESAREAN SECTION     CESAREAN SECTION  01/09/2011   Procedure: CESAREAN SECTION;  Surgeon: Esmeralda Arthur, MD;  Location: [facility];  Service: Gynecology;  Laterality: N/A;   LEFT HEART CATH AND CORONARY ANGIOGRAPHY N/A 08/29/2020   Procedure: LEFT HEART CATH AND CORONARY ANGIOGRAPHY;  Surgeon: Corky Crafts, MD;  Location: [facility] INVASIVE CV LAB;  Service: Cardiovascular;  Laterality: N/A;   LITHOTRIPSY     stint and stone removal ( ureter kinked)    Current Medications: Current Meds  Medication Sig   aspirin EC 81 MG tablet Take 81 mg by mouth daily. Swallow whole.   BELBUCA 300 MCG FILM Place 300 mcg inside cheek in the morning and at bedtime.   cetirizine (ZYRTEC) 10 MG tablet Take 10 mg by mouth daily as needed (seasonal allergies).   Cholecalciferol (VITAMIN D3) 125 MCG (5000 UT) TABS Take 5,000 Units by mouth at bedtime.   DULoxetine (CYMBALTA) 30 MG capsule Take 90 mg by mouth at bedtime.   ibuprofen (ADVIL,MOTRIN) 800 MG tablet Take 800 mg by mouth every 8 (eight) hours as needed (pain).   levonorgestrel (MIRENA) 20 MCG/24HR IUD 1 each by Intrauterine route once.    Multiple Vitamin (MULTIVITAMIN WITH MINERALS) TABS tablet Take  1 tablet by mouth at bedtime.   ondansetron (ZOFRAN) 4 MG tablet Take 4 mg by mouth every 8 (eight) hours as needed for nausea.    rOPINIRole (REQUIP) 0.25 MG tablet Take 0.25 mg by mouth at bedtime.   tiZANidine (ZANAFLEX) 4 MG tablet Take 8 mg by mouth at bedtime.     Allergies:   Oxycodone, Pregabalin, Gabapentin, Topiramate, Abilify [aripiprazole], Augmentin [amoxicillin-pot clavulanate], Hydrocodone-acetaminophen, and Imitrex [sumatriptan base]   Social History   Socioeconomic History   Marital status: Married    Spouse name: Not on file   Number of children: Not on file   Years of education: Not  on file   Highest education level: Not on file  Occupational History   Not on file  Tobacco Use   Smoking status: Never   Smokeless tobacco: Never  Substance and Sexual Activity   Alcohol use: No   Drug use: No   Sexual activity: Yes  Other Topics Concern   Not on file  Social History Narrative   Married.   Three children.  One deceased.     Social Determinants of Health   Financial Resource Strain: Not on file  Food Insecurity: Not on file  Transportation Needs: Not on file  Physical Activity: Not on file  Stress: Not on file  Social Connections: Not on file     Family History: The patient's family history includes CAD (age of onset: 35) in her father; Cancer in her mother; Depression in her maternal grandmother and mother; Hypertension in her father, maternal grandmother, and mother.  ROS:   Please see the history of present illness.     All other systems reviewed and are negative.  EKGs/Labs/Other Studies Reviewed:    The following studies were reviewed today:  Cath 08/29/2020 Dist RCA lesion is 25% stenosed. The left ventricular systolic function is normal. LV end diastolic pressure is normal. The left ventricular ejection fraction is 55-65% by visual estimate. There is no aortic valve stenosis.   Continue preventive therapy.    EKG:  EKG is not ordered today.     Recent Labs: 06/20/2020: Magnesium 2.2; TSH 2.483 08/23/2020: BUN 7; Creatinine, Ser 0.70; Hemoglobin 15.4; Platelets 301; Potassium 4.1; Sodium 139  Recent Lipid Panel    Component Value Date/Time   CHOL 206 (H) 09/28/2020 1526   TRIG 75 09/28/2020 1526   HDL 57 09/28/2020 1526   CHOLHDL 3.6 09/28/2020 1526   LDLCALC 136 (H) 09/28/2020 1526     Risk Assessment/Calculations:           Physical Exam:    VS:  BP 120/90 (BP Location: Left Arm)   Pulse (!) 105   Ht 5\' 3"  (1.6 m)   Wt 247 lb 3.2 oz (112.1 kg)   SpO2 99%   BMI 43.79 kg/m     Wt Readings from Last 3 Encounters:   09/28/20 247 lb 3.2 oz (112.1 kg)  08/29/20 250 lb (113.4 kg)  08/23/20 252 lb (114.3 kg)     GEN:  Well nourished, well developed in no acute distress HEENT: Normal NECK: No JVD; No carotid bruits LYMPHATICS: No lymphadenopathy CARDIAC: RRR, no murmurs, rubs, gallops RESPIRATORY:  Clear to auscultation without rales, wheezing or rhonchi  ABDOMEN: Soft, non-tender, non-distended MUSCULOSKELETAL:  No edema; No deformity  SKIN: Warm and dry NEUROLOGIC:  Alert and oriented x 3 PSYCHIATRIC:  Normal affect   ASSESSMENT:    1. Coronary artery disease involving native coronary artery of native heart without  angina pectoris   2. Hyperlipidemia, unspecified hyperlipidemia type   3. Tachycardia    PLAN:    In order of problems listed above:  CAD: Minimal CAD was noted on recent cardiac catheterization, this does not explain her chest pain.  No further work-up is planned at this time.  If symptoms worsen, may need to consider CTA to rule out PE.  She denies any significant shortness of breath.  Hyperlipidemia: Obtain fasting lipid panel and lipoprotein a  Tachycardia: Although she experiences occasional tachycardia, this was not seen on the previous heart monitor.  She has an apple watch and will continue to monitor at home.        Medication Adjustments/Labs and Tests Ordered: Current medicines are reviewed at length with the patient today.  Concerns regarding medicines are outlined above.  Orders Placed This Encounter  Procedures   Lipoprotein A (LPA)   Lipid panel   No orders of the defined types were placed in this encounter.   Patient Instructions  Medication Instructions:  Your physician recommends that you continue on your current medications as directed. Please refer to the Current Medication list given to you today.  *If you need a refill on your cardiac medications before your next appointment, please call your pharmacy*  Lab Work: Your physician recommends that  you return for lab work TODAY:  FLP LPA If you have labs (blood work) drawn today and your tests are completely normal, you will receive your results only by: MyChart Message (if you have MyChart) OR A paper copy in the mail If you have any lab test that is abnormal or we need to change your treatment, we will call you to review the results.  Testing/Procedures: NONE ordered at this time of appointment   Follow-Up: At [facility], you and your health needs are our priority.  As part of our continuing mission to provide you with exceptional heart care, we have created designated Provider Care Teams.  These Care Teams include your primary Cardiologist (physician) and Advanced Practice Providers (APPs -  Physician Assistants and Nurse Practitioners) who all work together to provide you with the care you need, when you need it.   Your next appointment:   3-4 month(s)  The format for your next appointment:   In Person  Provider:   Rollene Rotunda, MD  Other Instructions    Signed, Azalee Course, PA  10/01/2020 12:18 AM    [facility]

## 2020-09-28 NOTE — Patient Instructions (Signed)
Medication Instructions:  Your physician recommends that you continue on your current medications as directed. Please refer to the Current Medication list given to you today.  *If you need a refill on your cardiac medications before your next appointment, please call your pharmacy*  Lab Work: Your physician recommends that you return for lab work TODAY:  FLP LPA If you have labs (blood work) drawn today and your tests are completely normal, you will receive your results only by: MyChart Message (if you have MyChart) OR A paper copy in the mail If you have any lab test that is abnormal or we need to change your treatment, we will call you to review the results.  Testing/Procedures: NONE ordered at this time of appointment   Follow-Up: At [facility], you and your health needs are our priority.  As part of our continuing mission to provide you with exceptional heart care, we have created designated Provider Care Teams.  These Care Teams include your primary Cardiologist (physician) and Advanced Practice Providers (APPs -  Physician Assistants and Nurse Practitioners) who all work together to provide you with the care you need, when you need it.   Your next appointment:   3-4 month(s)  The format for your next appointment:   In Person  Provider:   Rollene Rotunda, MD  Other Instructions

## 2020-09-29 LAB — LIPID PANEL
Chol/HDL Ratio: 3.6 ratio (ref 0.0–4.4)
Cholesterol, Total: 206 mg/dL — ABNORMAL HIGH (ref 100–199)
HDL: 57 mg/dL (ref >39)
LDL Chol Calc (NIH): 136 mg/dL — ABNORMAL HIGH (ref 0–99)
Triglycerides: 75 mg/dL (ref 0–149)
VLDL Cholesterol Cal: 13 mg/dL (ref 5–40)

## 2020-09-29 LAB — LIPOPROTEIN A (LPA): Lipoprotein (a): 8.5 nmol/L (ref <75.0)

## 2020-09-30 ENCOUNTER — Telehealth: Payer: Self-pay

## 2020-09-30 ENCOUNTER — Other Ambulatory Visit: Payer: Self-pay

## 2020-09-30 DIAGNOSIS — E785 Hyperlipidemia, unspecified: Secondary | ICD-10-CM

## 2020-09-30 MED ORDER — ATORVASTATIN CALCIUM 20 MG PO TABS: 20.0000 mg | ORAL_TABLET | Freq: Every day | ORAL | 3 refills | Status: DC

## 2020-09-30 NOTE — Telephone Encounter (Addendum)
Called and left a detailed voice message for the patient and sent to MyChart the recommendations from [facility], [place].  Sent Rx for Lipitor 20 mg [facility] to [facility] [place] which is listed as patient's pharmacy of choice, placed repeat order for FLP and LFT for 2-3 months (12/01/20-12/30/20).   ----- Message from [place], [place] sent at 09/30/2020 10:31 AM EDT ----- Lipoprotein A normal. LDL (bad cholesterol) and total cholesterol are high. Recommend addition of lipitor 20mg  daily. Repeat FLP and LFT in 2-3 month.

## 2020-10-01 ENCOUNTER — Encounter: Payer: Self-pay | Admitting: Physician Assistant

## 2020-10-24 ENCOUNTER — Ambulatory Visit: Payer: BC Managed Care – PPO | Admitting: Cardiovascular Disease

## 2020-12-20 ENCOUNTER — Encounter: Payer: BLUE CROSS/BLUE SHIELD | Admitting: Physician Assistant

## 2020-12-22 NOTE — Progress Notes (Signed)
This encounter was created in error - please disregard.

## 2021-01-09 ENCOUNTER — Ambulatory Visit: Payer: BC Managed Care – PPO | Admitting: Cardiovascular Disease

## 2021-02-17 ENCOUNTER — Ambulatory Visit: Payer: BC Managed Care – PPO | Admitting: Cardiovascular Disease

## 2021-03-01 ENCOUNTER — Ambulatory Visit: Payer: BC Managed Care – PPO | Admitting: Cardiovascular Disease

## 2021-04-03 ENCOUNTER — Encounter (HOSPITAL_BASED_OUTPATIENT_CLINIC_OR_DEPARTMENT_OTHER): Payer: Self-pay | Admitting: Emergency Medicine

## 2021-04-03 ENCOUNTER — Emergency Department (HOSPITAL_BASED_OUTPATIENT_CLINIC_OR_DEPARTMENT_OTHER)
Admission: EM | Admit: 2021-04-03 | Discharge: 2021-04-03 | Disposition: A | Discharge status: Home / Self Care | Payer: BC Managed Care – PPO | Attending: Emergency Medicine | Admitting: Emergency Medicine

## 2021-04-03 ENCOUNTER — Other Ambulatory Visit: Payer: Self-pay

## 2021-04-03 DIAGNOSIS — K649 Unspecified hemorrhoids: Secondary | ICD-10-CM

## 2021-04-03 DIAGNOSIS — K625 Hemorrhage of anus and rectum: Secondary | ICD-10-CM | POA: Diagnosis present

## 2021-04-03 DIAGNOSIS — K5903 Drug induced constipation: Secondary | ICD-10-CM | POA: Diagnosis not present

## 2021-04-03 DIAGNOSIS — K644 Residual hemorrhoidal skin tags: Secondary | ICD-10-CM | POA: Insufficient documentation

## 2021-04-03 DIAGNOSIS — Z7982 Long term (current) use of aspirin: Secondary | ICD-10-CM | POA: Insufficient documentation

## 2021-04-03 MED ORDER — HYDROCORTISONE ACETATE 25 MG RE SUPP: 25.0000 mg | Freq: Two times a day (BID) | RECTAL | 0 refills | Status: AC

## 2021-04-03 MED ORDER — DOCUSATE SODIUM 100 MG PO CAPS: 100.0000 mg | ORAL_CAPSULE | Freq: Two times a day (BID) | ORAL | 1 refills | Status: AC

## 2021-04-03 NOTE — ED Provider Notes (Signed)
[facility] EMERGENCY DEPARTMENT Provider Note   CSN: [id number] Arrival date & time: 04/03/21  0455     History  Chief Complaint  Patient presents with   Rectal Pain    Laura Tucker is a 41 y.o. female.  Patient has history of constipation secondary to chronic opioid use.  Soundly she had a particularly difficult bowel movement today and since then she has had bloody stools.  This is happened to her in the past but had her concern is that she also had mucus in her stool and then a temperature of 101.  She concern for possible infection so she presents here for further evaluation.  No illness prior to this.  Her fever was 1 time and has not had any other ones.  She has some rectal discomfort however the bleeding seems to be improving.   Rectal Bleeding Quality:  Bright red Amount:  Moderate Timing:  Constant Chronicity:  New Context: rectal pain       Home Medications Prior to Admission medications   Medication Sig Start Date End Date Taking? Authorizing Provider  docusate sodium (COLACE) 100 MG capsule Take 1 capsule (100 mg total) by mouth every 12 (twelve) hours. 04/03/21  Yes Drucella Karbowski, Barbara Cower, MD  hydrocortisone (ANUSOL-HC) 25 MG suppository Place 1 suppository (25 mg total) rectally 2 (two) times daily. 04/03/21  Yes Rollo Farquhar, Barbara Cower, MD  aspirin EC 81 MG tablet Take 81 mg by mouth daily. Swallow whole.    [provider]  atorvastatin (LIPITOR) 20 MG tablet Take 1 tablet (20 mg total) by mouth daily. 09/30/20   Azalee Course, PA  BELBUCA 300 MCG FILM Place 300 mcg inside cheek in the morning and at bedtime. 06/16/20   [provider]  cetirizine (ZYRTEC) 10 MG tablet Take 10 mg by mouth daily as needed (seasonal allergies).    [provider]  Cholecalciferol (VITAMIN D3) 125 MCG (5000 UT) TABS Take 5,000 Units by mouth at bedtime.    [provider]  DULoxetine (CYMBALTA) 30 MG capsule Take 90 mg by mouth at bedtime. 04/05/15   [provider]  ibuprofen (ADVIL,MOTRIN) 800 MG tablet Take 800 mg by mouth every 8 (eight) hours as needed (pain).    [provider]  levonorgestrel (MIRENA) 20 MCG/24HR IUD 1 each by Intrauterine route once.     [provider]  Multiple Vitamin (MULTIVITAMIN WITH MINERALS) TABS tablet Take 1 tablet by mouth at bedtime.    [provider]  ondansetron (ZOFRAN) 4 MG tablet Take 4 mg by mouth every 8 (eight) hours as needed for nausea.     [provider]  rOPINIRole (REQUIP) 0.25 MG tablet Take 0.25 mg by mouth at bedtime. 08/07/20   [provider]  tiZANidine (ZANAFLEX) 4 MG tablet Take 8 mg by mouth at bedtime. 02/09/15   [provider]      Allergies    Oxycodone, Pregabalin, Gabapentin, Topiramate, Abilify [aripiprazole], Augmentin [amoxicillin-pot clavulanate], Hydrocodone-acetaminophen, and Imitrex [sumatriptan base]    Review of Systems   Review of Systems  Gastrointestinal:  Positive for hematochezia.   Physical Exam Updated Vital Signs BP (!) 106/58    Pulse 96    Temp 99.6 F (37.6 C) (Oral)    Resp 18    Ht 5\' 3"  (1.6 m)    Wt 110.2 kg    SpO2 96%    BMI 43.05 kg/m  Physical Exam Vitals and nursing note reviewed.  Constitutional:  Appearance: She is well-developed.  HENT:     Head: Normocephalic and atraumatic.     Mouth/Throat:     Mouth: Mucous membranes are moist.     Pharynx: Oropharynx is clear.  Eyes:     Pupils: Pupils are equal, round, and reactive to light.  Cardiovascular:     Rate and Rhythm: Normal rate and regular rhythm.  Pulmonary:     Effort: Pulmonary effort is normal. No respiratory distress.     Breath sounds: No stridor.  Abdominal:     General: Abdomen is flat. There is no distension.  Genitourinary:    Comments: Chaperoned by nurse Shawna Orleans.   Small external hemorrhoid with evidence of recent bleeding.   Anoscopy did not show any obvious anal fissures or distal injuries.   Musculoskeletal:        General: No swelling, tenderness or deformity. Normal range of motion.     Cervical back: Normal range of motion.  Skin:    General: Skin is warm and dry.  Neurological:     General: No focal deficit present.     Mental Status: She is alert.    ED Results / Procedures / Treatments   Labs (all labs ordered are listed, but only abnormal results are displayed) Labs Reviewed - No data to display  EKG None  Radiology No results found.  Procedures Anoscopy  Date/Time: 04/03/2021 11:50 PM Performed by: Marily Memos, MD Authorized by: Marily Memos, MD   Consent:    Consent obtained:  Verbal   Consent given by:  Patient   Risks, benefits, and alternatives were discussed: yes     Risks discussed:  Bleeding and pain   Alternatives discussed:  No treatment, alternative treatment, observation and referral Universal protocol:    Procedure explained and questions answered to patient or proxy's satisfaction: yes     Relevant documents present and verified: yes     Test results available: yes     Patient identity confirmed:  Verbally with patient Indications:    Indications: rectal bleeding and rectal pain   Procedure details:    Internal hemorrhoids: no     Inflammation: no     Anal fissures: no     Anal fistulae: no     Anal stricture: no     Abscess: no     Tearing: no     Blood in rectal vault: no   Post-procedure details:    Procedure completion:  Tolerated   Medications Ordered in ED Medications - No data to display  ED Course/ Medical Decision Making/ A&P                           Medical Decision Making  No obvious anal fissures on exam or anoscopy however we will go ahead and treat with Anusol for comfort.  She will do sitz bath as well.  She also used some Preparation H over that hemorrhoid that had the stigmata of recent bleeding.  This may very well be the cause of her bleeding.  Do not see any reason why she would have mucus or  infection.  I suspect that the temperature was probably just related to the stress of the event rather than a true infection.  She has not febrile here.  She is not tachycardic, hypotensive I have any other evidence of impending hemorrhagic shock to be concerned enough to check hemoglobin.  Not on any blood thinners.  We will follow-up  with GI as needed I also talked to her about taking Colace twice a day plus or minus MiraLAX as needed for constipation   Final Clinical Impression(s) / ED Diagnoses Final diagnoses:  Drug-induced constipation  Hemorrhoids, unspecified hemorrhoid type  Rectal bleeding    Rx / DC Orders ED Discharge Orders          Ordered    hydrocortisone (ANUSOL-HC) 25 MG suppository  2 times daily        04/03/21 0620    docusate sodium (COLACE) 100 MG capsule  Every 12 hours        04/03/21 0621              [place] Urieta, Barbara CowerJason, MD 04/03/21 2352

## 2021-04-03 NOTE — ED Triage Notes (Signed)
Pt states she has chronic constipation. Last night she had a traumatic BM "I felt like I was giving birth through my bottom, there was a lot of blood, and mucous or pus". Pt states she is still having rectal pain today. Also notes having had a fever at home. Tmax 101.1. Last Motrin dose 0200. Endorses nausea but denies vomiting.

## 2021-04-03 NOTE — ED Notes (Signed)
Discharge instructions discussed with pt. Pt verbalized understanding. Pt stable and ambulatory.  °

## 2021-04-03 NOTE — Discharge Instructions (Signed)
Please ensure you are drinking plenty of water and fiber. Please use colace twice daily as long as you are on the buprenorphine. You can use miralax as needed for constipation or daily if the the colace is not helping.   Please use the suppositories and sitz baths to help with the rectal discomfort and bleeding.

## 2021-08-13 IMAGING — CT CT HEART MORP W/ CTA COR W/ SCORE W/ CA W/CM &/OR W/O CM
4 of 7 series · 8 of 20 positions shown, 9 images · IV contrast (APPLIED)
Comparison: 05/03/2015
COMPARISON: 05/03/2015

Addendum:
EXAM:
OVER-READ INTERPRETATION  CT CHEST

The following report is an over-read performed by radiologist Dr.
Florentzos Marcou [[email or web address]] on 07/28/2020. This over-read
does not include interpretation of cardiac or coronary anatomy or
pathology. The coronary CTA interpretation by the cardiologist is
attached.
CLINICAL DATA: 39F with atypical chest pain and abnormal stress
test.
Cardiac/Coronary  CT
TECHNIQUE: The patient was scanned on a Phillips Force scanner.

[Series 6: best diast 74 % · axial · 0.39mm/px · z∈[+1274,+1312]mm · 2 of 286 slices shown]
[im 96/286  vessel]
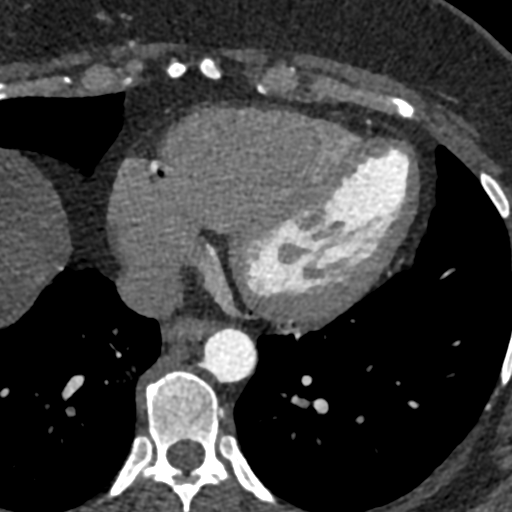
[im 191/286  vessel]
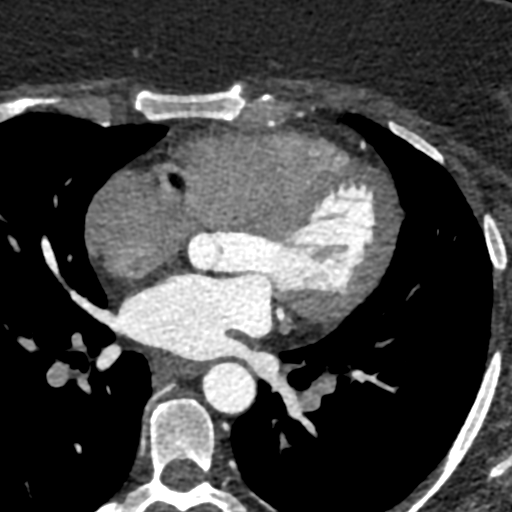

[Series 7: best syst · axial · 0.39mm/px · z∈[+1274,+1312]mm · 2 of 286 slices shown, 3 images]
[im 96/286  vessel]
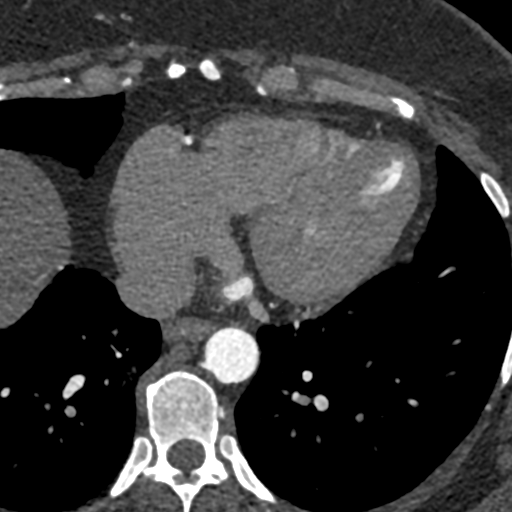
[im 96/286  lung]
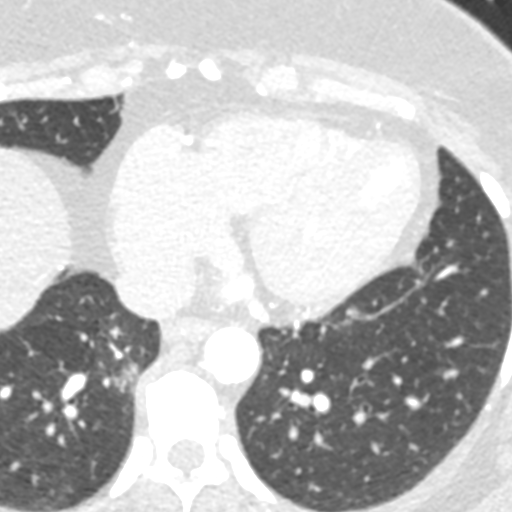
[im 191/286  vessel]
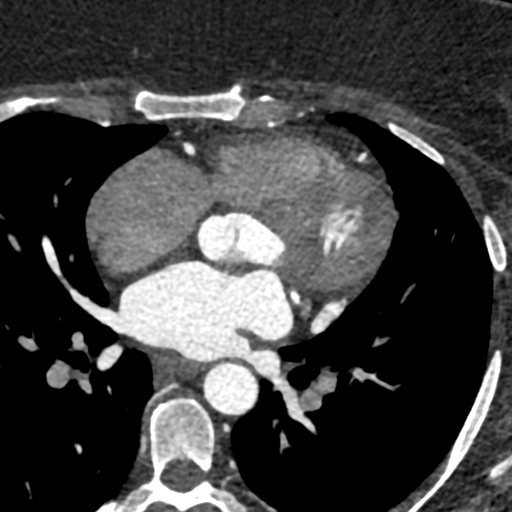

[Series 8: ts diast sharp 74 % · axial · 0.39mm/px · z∈[+1274,+1312]mm · 2 of 286 slices shown]
[im 96/286  lung]
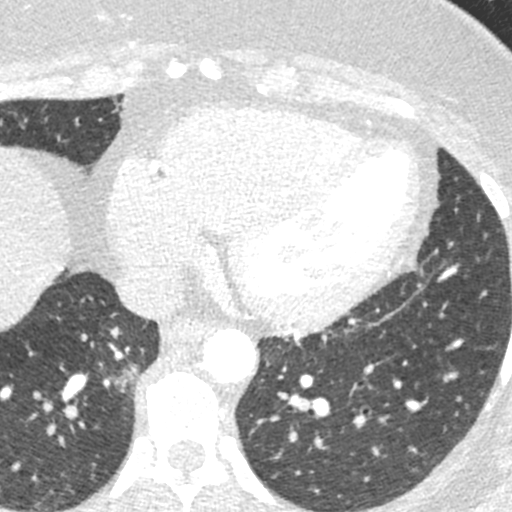
[im 191/286  lung]
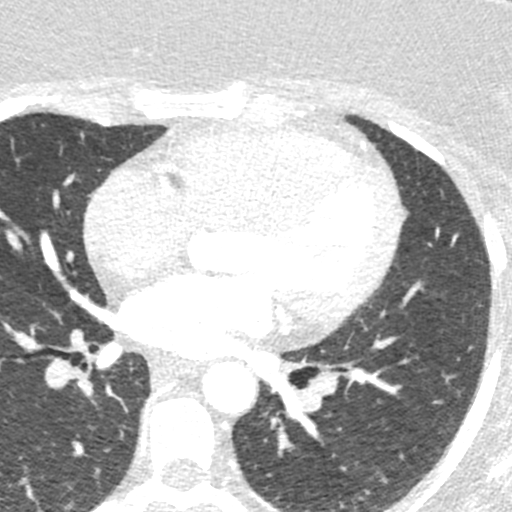

[Series 9: ts syst sharp · axial · 0.39mm/px · z∈[+1274,+1312]mm · 2 of 286 slices shown]
[im 96/286  lung]
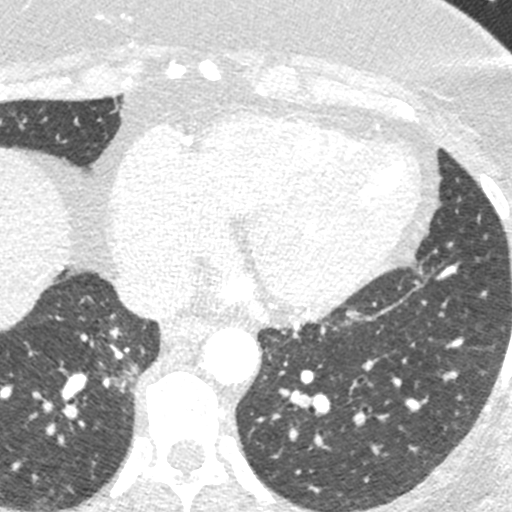
[im 191/286  lung]
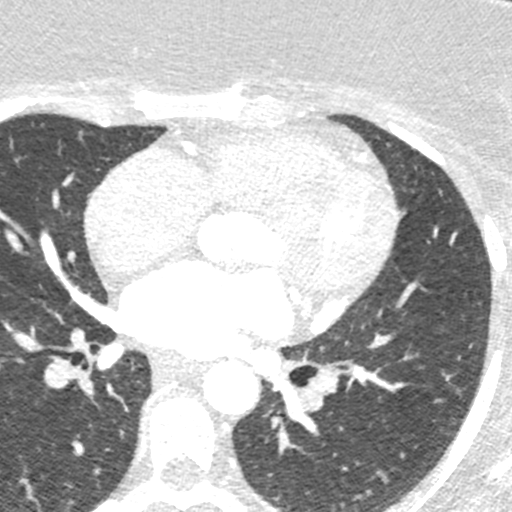

[8 of 20 positions shown; findings below may reference images not displayed]

FINDINGS: Vascular: Heart is normal size.  Visualized aorta normal caliber.

Mediastinum/Nodes: No adenopathy.

Lungs/Pleura: No confluent airspace opacities or effusions.

Upper Abdomen: No acute findings in the upper abdomen.

Musculoskeletal: Chest wall soft tissues are unremarkable. No acute
bony abnormality.
IMPRESSION: No acute or significant extracardiac abnormality.
FINDINGS: A 120 kV prospective scan was triggered in the descending thoracic
aorta at 111 HU's. Axial non-contrast 3 mm slices were carried out
through the heart. The data set was analyzed on a dedicated work
station and scored using the Agatson method. Gantry rotation speed
was 250 msecs and collimation was .6 mm. No beta blockade and 0.8 mg
of sl NTG was given. The 3D data set was reconstructed in 5%
intervals of the 67-82 % of the R-R cycle. Diastolic phases were
analyzed on a dedicated work station using MPR, MIP and VRT modes.
The patient received 80 cc of contrast.

Aorta: Normal size.  No calcifications.  No dissection.

Aortic Valve:  Trileaflet.  No calcifications.

Coronary Arteries:  Normal coronary origin.  Right dominance.

RCA is a large dominant artery that gives rise to PDA and PLVB.
There is moderate (50-69%) stenosis at the distal RCA just proximal
the R-PDA. The vessel is narrow but there is no plaque in this
region.

Left main is a large artery that gives rise to LAD and LCX arteries.

LAD is a large vessel that has no plaque.

LCX is a non-dominant artery that gives rise to two OM branches.
There is no plaque.

Coronary Calcium Score: 0

Percentile: 0

Other findings:

Normal pulmonary vein drainage into the left atrium. There are three
pulmonary veins on the right and two on the left.

Normal let atrial appendage without a thrombus.

Normal size of the pulmonary artery.
IMPRESSION: 1. Coronary calcium score of 0. This was 0 percentile for age-,
race-, and sex-matched controls.

2. Normal coronary origin with right dominance.

3. There is moderate (50-69%) stenosis in the distal RCA. Will send
for FFRct.

*** End of Addendum ***
EXAM:
OVER-READ INTERPRETATION  CT CHEST

The following report is an over-read performed by radiologist Dr.
Florentzos Marcou [REDACTED] on 07/28/2020. This over-read
does not include interpretation of cardiac or coronary anatomy or
pathology. The coronary CTA interpretation by the cardiologist is
attached.
FINDINGS: Vascular: Heart is normal size.  Visualized aorta normal caliber.

Mediastinum/Nodes: No adenopathy.

Lungs/Pleura: No confluent airspace opacities or effusions.

Upper Abdomen: No acute findings in the upper abdomen.

Musculoskeletal: Chest wall soft tissues are unremarkable. No acute
bony abnormality.
IMPRESSION: No acute or significant extracardiac abnormality.

## 2021-10-01 ENCOUNTER — Other Ambulatory Visit: Payer: Self-pay | Admitting: Physician Assistant

## 2021-10-01 DIAGNOSIS — E785 Hyperlipidemia, unspecified: Secondary | ICD-10-CM

## 2021-12-29 ENCOUNTER — Other Ambulatory Visit: Payer: Self-pay | Admitting: Physician Assistant

## 2021-12-29 DIAGNOSIS — E785 Hyperlipidemia, unspecified: Secondary | ICD-10-CM

## 2022-01-15 ENCOUNTER — Other Ambulatory Visit: Payer: Self-pay | Admitting: Cardiology

## 2022-01-15 DIAGNOSIS — E785 Hyperlipidemia, unspecified: Secondary | ICD-10-CM

## 2022-02-03 ENCOUNTER — Other Ambulatory Visit: Payer: Self-pay | Admitting: Cardiovascular Disease

## 2022-02-03 DIAGNOSIS — E785 Hyperlipidemia, unspecified: Secondary | ICD-10-CM

## 2022-02-05 ENCOUNTER — Other Ambulatory Visit: Payer: Self-pay | Admitting: Cardiovascular Disease

## 2022-02-05 DIAGNOSIS — E785 Hyperlipidemia, unspecified: Secondary | ICD-10-CM

## 2024-03-10 ENCOUNTER — Other Ambulatory Visit: Payer: Self-pay | Admitting: Obstetrics and Gynecology

## 2024-03-10 DIAGNOSIS — Z1231 Encounter for screening mammogram for malignant neoplasm of breast: Secondary | ICD-10-CM

## 2024-04-08 ENCOUNTER — Ambulatory Visit
Admission: RE | Admit: 2024-04-08 | Discharge: 2024-04-08 | Disposition: A | Source: Ambulatory Visit | Attending: Obstetrics and Gynecology | Admitting: Obstetrics and Gynecology

## 2024-04-08 DIAGNOSIS — Z1231 Encounter for screening mammogram for malignant neoplasm of breast: Secondary | ICD-10-CM
